# Patient Record
Sex: Female | Born: 1985 | Race: White | Hispanic: Yes | Marital: Single | Smoking: Never smoker
Health system: Southern US, Community
[De-identification: ages and names within clinical notes are randomized; demographics above are authoritative.]

## PROBLEM LIST (undated history)

## (undated) DIAGNOSIS — K759 Inflammatory liver disease, unspecified: Secondary | ICD-10-CM

## (undated) DIAGNOSIS — Z8489 Family history of other specified conditions: Secondary | ICD-10-CM

## (undated) HISTORY — PX: NO PAST SURGERIES: SHX2092

---
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
## 2007-10-18 ENCOUNTER — Emergency Department (HOSPITAL_COMMUNITY): Admission: EM | Admit: 2007-10-18 | Discharge: 2007-10-18 | Payer: Self-pay | Admitting: Family Medicine

## 2015-08-18 ENCOUNTER — Ambulatory Visit: Payer: Self-pay | Admitting: Gynecology

## 2015-08-26 ENCOUNTER — Ambulatory Visit (INDEPENDENT_AMBULATORY_CARE_PROVIDER_SITE_OTHER): Payer: BLUE CROSS/BLUE SHIELD | Admitting: Women's Health

## 2015-08-26 ENCOUNTER — Encounter: Payer: Self-pay | Admitting: Women's Health

## 2015-08-26 VITALS — BP 132/86 | Ht 70.75 in | Wt 359.0 lb

## 2015-08-26 DIAGNOSIS — N938 Other specified abnormal uterine and vaginal bleeding: Secondary | ICD-10-CM | POA: Diagnosis not present

## 2015-08-26 DIAGNOSIS — N939 Abnormal uterine and vaginal bleeding, unspecified: Secondary | ICD-10-CM

## 2015-08-26 DIAGNOSIS — Z01419 Encounter for gynecological examination (general) (routine) without abnormal findings: Secondary | ICD-10-CM

## 2015-08-26 DIAGNOSIS — N923 Ovulation bleeding: Secondary | ICD-10-CM

## 2015-08-26 LAB — PREGNANCY, URINE: Preg Test, Ur: NEGATIVE

## 2015-08-26 MED ORDER — MEDROXYPROGESTERONE ACETATE 10 MG PO TABS: 10.0000 mg | ORAL_TABLET | Freq: Every day | ORAL | Status: DC

## 2015-08-26 NOTE — Progress Notes (Signed)
Mary Olson April 12, 1986 [id number]    History:    Presents for annual exam.  New patient with a problem. Has been seen by primary care and has had numerous normal labs and thyroid. Has been on phentermine in the past for weight loss with minimal success. History of cycles every 1-3 months, questionable history of PCOS. Has always had irregular cycles since starting. Has used OCs in the past with some irregular bleeding while on.  [place]. For the past 3 months she has had spotting/bleeding more days of the month than not. Did not receive gardasil. Has not had a Pap. Weight is greater than 350. States when she has regular exercise and weight is down she does have a monthly cycle. Reports numerous family members are obese.  Past medical history, past surgical history, family history and social history were all reviewed and documented in the EPIC chart. From [place] bilingual. Works in a [employer] in [place]. Mother hypertension, father diabetes.  ROS:  A ROS was performed and pertinent positives and negatives are included.  Exam:  Filed Vitals:   08/26/15 0940  BP: 132/86    General appearance:  Normal Thyroid:  Symmetrical, normal in size, without palpable masses or nodularity. Respiratory  Auscultation:  Clear without wheezing or rhonchi Cardiovascular  Auscultation:  Regular rate, without rubs, murmurs or gallops  Edema/varicosities:  Not grossly evident Abdominal  Soft,nontender, without masses, guarding or rebound.  Liver/spleen:  No organomegaly noted  Hernia:  None appreciated  Skin  Inspection:  Grossly normal   Breasts: Examined lying and sitting.     Right: Without masses, retractions, discharge or axillary adenopathy.     Left: Without masses, retractions, discharge or axillary adenopathy. Gentitourinary   Inguinal/mons:  Normal without inguinal adenopathy  External genitalia:  Normal  BUS/Urethra/Skene's glands:  Normal  Vagina:  Normal Moderate  menses  Cervix:  Normal  Uterus:   normal in size, shape and contour.  Midline and mobile  Adnexa/parametria:     Rt: Without masses or tenderness.   Lt: Without masses or tenderness.  Anus and perineum: Normal  Digital rectal exam: Normal sphincter tone without palpated masses or tenderness  Assessment/Plan:  30 y.o. [facility] for annual exam with irregular bleeding.  Irregular bleeding/spotting for the past 3 months Morbid obesity Labs-primary care  Plan: Provera 10 mg by mouth daily for 10 days, ultrasound after completing. Discussed options of birth control pills, Mirena IUD. Reviewed importance of weight loss for health, discussed weight loss surgery, encouraged to avoid phentermine due to risks/side effects and minimal weight loss. SBE's, exercise, decrease calories, MVI daily encouraged. Contraception reviewed and declined need. Pap. UPT negative    Mary ChallengerYOUNG,Mary Olson [facility] PartnersWHNP, 11:43 AM 08/26/2015

## 2015-08-26 NOTE — Patient Instructions (Addendum)
Sleeve Gastrectomy A sleeve gastrectomy is a surgery in which a large portion of the stomach is removed. After the surgery, the stomach will be a narrow tube about the size of a banana. This surgery is performed to help a person lose weight. The person loses weight because the reduced size of the stomach restricts the amount of food that the person can eat. The stomach will hold much less food than before the surgery. Also, the part of the stomach that is removed produces a hormone that causes hunger.  This surgery is done for people who have morbid obesity, defined as a body mass index (BMI) greater than 40. BMI is an estimate of body fat and is calculated from the height and weight of a person. This surgery may also be done for people with a BMI between 35 and 40 if they have other diseases, such as type 2 diabetes mellitus, obstructive sleep apnea, or heart and lung disorders (cardiopulmonary diseases).  LET [facility] CARE PROVIDER KNOW ABOUT:  Any allergies you have.   All medicines you are taking, including vitamins, herbs, eyedrops, creams, and over-the-counter medicines.   Use of steroids (by mouth or creams).   Previous problems you or members of your family have had with the use of anesthetics.   Any blood disorders you have.   Previous surgeries you have had.   Possibility of pregnancy, if this applies.   Other health problems you have. RISKS AND COMPLICATIONS Generally, sleeve gastrectomy is a safe procedure. However, as with any procedure, complications can occur. Possible complications include:  Infection.  Bleeding.  Blood clots.  Damage to other organs or tissue.  Leakage of fluid from the stomach into the abdominal cavity (rare). BEFORE THE PROCEDURE  You may need to have blood tests and imaging tests (such as X-rays or ultrasonography) done before the day of surgery. A test to evaluate your esophagus and how it moves (esophageal manometry) may also be  done.  You may be placed on a liquid diet 2-3 weeks before the surgery.  Ask your health care provider about changing or stopping your regular medicines.  Do not eat or drink anything for at least 8 hours before the procedure.   Make plans to have someone drive you home after your hospital stay. Also arrange for someone to help you with activities during recovery. PROCEDURE  A laparoscopic technique is usually used for this surgery:  You will be given medicine to make you sleep through the procedure (general anesthetic). This medicine will be given through an intravenous (IV) access tube that is put into one of your veins.  Once you are asleep, your abdomen will be cleaned and sterilized.  Several small incisions will be made in your abdomen.  Your abdomen will be filled with air so that it expands. This gives the surgeon more room to operate and makes your organs easier to see.  A thin, lighted tube with a tiny camera on the end (laparoscope) is put through a small incision in your abdomen. The camera on the laparoscope sends a picture to a TV screen in the operating room. This gives the surgeon a good view inside the abdomen.  Hollow tubes are put through the other small incisions in your abdomen. The tools needed for the procedure are put through these tubes.  The surgeon uses staples to divide part of the stomach and then removes it through one of the incisions.  The remaining stomach may be reinforced using stitches  or surgical glue or both to prevent leakage of the stomach contents. A small tube (drain) may be placed through one of the incisions to allow extra fluid to flow from the area.  The incisions are closed with stitches, staples, or glue. AFTER THE PROCEDURE  You will be monitored closely in a recovery area. Once the anesthetic has worn off, you will likely be moved to a regular hospital room.  You will be given medicine for pain and nausea.   You may have a drain  from one of the incisions in your abdomen. If a drain is used, it may stay in place after you go home from the hospital and be removed at a follow-up appointment.   You will be encouraged to walk around several times a day. This helps prevent blood clots.  You will be started on a liquid diet the first day after your surgery. Sometimes a test is done to check for leaking before you can eat.  You will be urged to cough and do deep breathing exercises. This helps prevent a lung infection after a surgery.  You will likely need to stay in the hospital for a few days.    This information is not intended to replace advice given to you by your health care provider. Make sure you discuss any questions you have with your health care provider.   Document Released: 03/28/2009 Document Revised: 01/31/2013 Document Reviewed: 11/22/2014 Elsevier Interactive Patient Education 2016 [place] Maintenance, Female Adopting a healthy lifestyle and getting preventive care can go a long way to promote health and wellness. Talk with your health care provider about what schedule of regular examinations is right for you. This is a good chance for you to check in with your provider about disease prevention and staying healthy. In between checkups, there are plenty of things you can do on your own. Experts have done a lot of research about which lifestyle changes and preventive measures are most likely to keep you healthy. Ask your health care provider for more information. WEIGHT AND DIET  Eat a healthy diet  Be sure to include plenty of vegetables, fruits, low-fat dairy products, and lean protein.  Do not eat a lot of foods high in solid fats, added sugars, or salt.  Get regular exercise. This is one of the most important things you can do for your health.  Most adults should exercise for at least 150 minutes each week. The exercise should increase your heart rate and make you sweat  (moderate-intensity exercise).  Most adults should also do strengthening exercises at least twice a week. This is in addition to the moderate-intensity exercise.  Maintain a healthy weight  Body mass index (BMI) is a measurement that can be used to identify possible weight problems. It estimates body fat based on height and weight. Your health care provider can help determine your BMI and help you achieve or maintain a healthy weight.  For females 16 years of age and older:   A BMI below 18.5 is considered underweight.  A BMI of 18.5 to 24.9 is normal.  A BMI of 25 to 29.9 is considered overweight.  A BMI of 30 and above is considered obese.  Watch levels of cholesterol and blood lipids  You should start having your blood tested for lipids and cholesterol at 30 years of age, then have this test every 5 years.  You may need to have your cholesterol levels checked more often if:  Your lipid  or cholesterol levels are high.  You are older than 30 years of age.  You are at high risk for heart disease.  CANCER SCREENING   Lung Cancer  Lung cancer screening is recommended for adults 65-42 years old who are at high risk for lung cancer because of a history of smoking.  A yearly low-dose CT scan of the lungs is recommended for people who:  Currently smoke.  Have quit within the past 15 years.  Have at least a 30-pack-year history of smoking. A pack year is smoking an average of one pack of cigarettes a day for 1 year.  Yearly screening should continue until it has been 15 years since you quit.  Yearly screening should stop if you develop a health problem that would prevent you from having lung cancer treatment.  Breast Cancer  Practice breast self-awareness. This means understanding how your breasts normally appear and feel.  It also means doing regular breast self-exams. Let your health care provider know about any changes, no matter how small.  If you are in your 20s  or 30s, you should have a clinical breast exam (CBE) by a health care provider every 1-3 years as part of a regular health exam.  If you are 49 or older, have a CBE every year. Also consider having a breast X-ray (mammogram) every year.  If you have a family history of breast cancer, talk to your health care provider about genetic screening.  If you are at high risk for breast cancer, talk to your health care provider about having an MRI and a mammogram every year.  Breast cancer gene (BRCA) assessment is recommended for women who have family members with BRCA-related cancers. BRCA-related cancers include:  Breast.  Ovarian.  Tubal.  Peritoneal cancers.  Results of the assessment will determine the need for genetic counseling and BRCA1 and BRCA2 testing. Cervical Cancer Your health care provider may recommend that you be screened regularly for cancer of the pelvic organs (ovaries, uterus, and vagina). This screening involves a pelvic examination, including checking for microscopic changes to the surface of your cervix (Pap test). You may be encouraged to have this screening done every 3 years, beginning at age 77.  For women ages 60-65, health care providers may recommend pelvic exams and Pap testing every 3 years, or they may recommend the Pap and pelvic exam, combined with testing for human papilloma virus (HPV), every 5 years. Some types of HPV increase your risk of cervical cancer. Testing for HPV may also be done on women of any age with unclear Pap test results.  Other health care providers may not recommend any screening for nonpregnant women who are considered low risk for pelvic cancer and who do not have symptoms. Ask your health care provider if a screening pelvic exam is right for you.  If you have had past treatment for cervical cancer or a condition that could lead to cancer, you need Pap tests and screening for cancer for at least 20 years after your treatment. If Pap tests  have been discontinued, your risk factors (such as having a new sexual partner) need to be reassessed to determine if screening should resume. Some women have medical problems that increase the chance of getting cervical cancer. In these cases, your health care provider may recommend more frequent screening and Pap tests. Colorectal Cancer  This type of cancer can be detected and often prevented.  Routine colorectal cancer screening usually begins at 30 years of age  and continues through 30 years of age.  Your health care provider may recommend screening at an earlier age if you have risk factors for colon cancer.  Your health care provider may also recommend using home test kits to check for hidden blood in the stool.  A small camera at the end of a tube can be used to examine your colon directly (sigmoidoscopy or colonoscopy). This is done to check for the earliest forms of colorectal cancer.  Routine screening usually begins at age 51.  Direct examination of the colon should be repeated every 5-10 years through 30 years of age. However, you may need to be screened more often if early forms of precancerous polyps or small growths are found. Skin Cancer  Check your skin from head to toe regularly.  Tell your health care provider about any new moles or changes in moles, especially if there is a change in a mole's shape or color.  Also tell your health care provider if you have a mole that is larger than the size of a pencil eraser.  Always use sunscreen. Apply sunscreen liberally and repeatedly throughout the day.  Protect yourself by wearing long sleeves, pants, a wide-brimmed hat, and sunglasses whenever you are outside. HEART DISEASE, DIABETES, AND HIGH BLOOD PRESSURE   High blood pressure causes heart disease and increases the risk of stroke. High blood pressure is more likely to develop in:  People who have blood pressure in the high end of the normal range (130-139/85-89 mm  Hg).  People who are overweight or obese.  People who are African American.  If you are 59-69 years of age, have your blood pressure checked every 3-5 years. If you are 51 years of age or older, have your blood pressure checked every year. You should have your blood pressure measured twice--once when you are at a hospital or clinic, and once when you are not at a hospital or clinic. Record the average of the two measurements. To check your blood pressure when you are not at a hospital or clinic, you can use:  An automated blood pressure machine at a pharmacy.  A home blood pressure monitor.  If you are between 45 years and 59 years old, ask your health care provider if you should take aspirin to prevent strokes.  Have regular diabetes screenings. This involves taking a blood sample to check your fasting blood sugar level.  If you are at a normal weight and have a low risk for diabetes, have this test once every three years after 30 years of age.  If you are overweight and have a high risk for diabetes, consider being tested at a younger age or more often. PREVENTING INFECTION  Hepatitis B  If you have a higher risk for hepatitis B, you should be screened for this virus. You are considered at high risk for hepatitis B if:  You were born in a country where hepatitis B is common. Ask your health care provider which countries are considered high risk.  Your parents were born in a high-risk country, and you have not been immunized against hepatitis B (hepatitis B vaccine).  You have HIV or AIDS.  You use needles to inject street drugs.  You live with someone who has hepatitis B.  You have had sex with someone who has hepatitis B.  You get hemodialysis treatment.  You take certain medicines for conditions, including cancer, organ transplantation, and autoimmune conditions. Hepatitis C  Blood testing is recommended for:  Everyone born from [birth year] through 1965.  Anyone with known  risk factors for hepatitis C. Sexually transmitted infections (STIs)  You should be screened for sexually transmitted infections (STIs) including gonorrhea and chlamydia if:  You are sexually active and are younger than 30 years of age.  You are older than 29 years of age and your health care provider tells you that you are at risk for this type of infection.  Your sexual activity has changed since you were last screened and you are at an increased risk for chlamydia or gonorrhea. Ask your health care provider if you are at risk.  If you do not have HIV, but are at risk, it may be recommended that you take a prescription medicine daily to prevent HIV infection. This is called pre-exposure prophylaxis (PrEP). You are considered at risk if:  You are sexually active and do not regularly use condoms or know the HIV status of your partner(s).  You take drugs by injection.  You are sexually active with a partner who has HIV. Talk with your health care provider about whether you are at high risk of being infected with HIV. If you choose to begin PrEP, you should first be tested for HIV. You should then be tested every 3 months for as long as you are taking PrEP.  PREGNANCY   If you are premenopausal and you may become pregnant, ask your health care provider about preconception counseling.  If you may become pregnant, take 400 to 800 micrograms (mcg) of folic acid every day.  If you want to prevent pregnancy, talk to your health care provider about birth control (contraception). OSTEOPOROSIS AND MENOPAUSE   Osteoporosis is a disease in which the bones lose minerals and strength with aging. This can result in serious bone fractures. Your risk for osteoporosis can be identified using a bone density scan.  If you are 27 years of age or older, or if you are at risk for osteoporosis and fractures, ask your health care provider if you should be screened.  Ask your health care provider whether you  should take a calcium or vitamin D supplement to lower your risk for osteoporosis.  Menopause may have certain physical symptoms and risks.  Hormone replacement therapy may reduce some of these symptoms and risks. Talk to your health care provider about whether hormone replacement therapy is right for you.  HOME CARE INSTRUCTIONS   Schedule regular health, dental, and eye exams.  Stay current with your immunizations.   Do not use any tobacco products including cigarettes, chewing tobacco, or electronic cigarettes.  If you are pregnant, do not drink alcohol.  If you are breastfeeding, limit how much and how often you drink alcohol.  Limit alcohol intake to no more than 1 drink per day for nonpregnant women. One drink equals 12 ounces of beer, 5 ounces of wine, or 1 ounces of hard liquor.  Do not use street drugs.  Do not share needles.  Ask your health care provider for help if you need support or information about quitting drugs.  Tell your health care provider if you often feel depressed.  Tell your health care provider if you have ever been abused or do not feel safe at home.   This information is not intended to replace advice given to you by your health care provider. Make sure you discuss any questions you have with your health care provider.   Document Released: 12/14/2010 Document Revised: 06/21/2014 Document Reviewed: 05/02/2013 Elsevier Interactive  Patient Education 2016 [employer]. Health Maintenance, Female Adopting a healthy lifestyle and getting preventive care can go a long way to promote health and wellness. Talk with your health care provider about what schedule of regular examinations is right for you. This is a good chance for you to check in with your provider about disease prevention and staying healthy. In between checkups, there are plenty of things you can do on your own. Experts have done a lot of research about which lifestyle changes and preventive  measures are most likely to keep you healthy. Ask your health care provider for more information. WEIGHT AND DIET  Eat a healthy diet  Be sure to include plenty of vegetables, fruits, low-fat dairy products, and lean protein.  Do not eat a lot of foods high in solid fats, added sugars, or salt.  Get regular exercise. This is one of the most important things you can do for your health.  Most adults should exercise for at least 150 minutes each week. The exercise should increase your heart rate and make you sweat (moderate-intensity exercise).  Most adults should also do strengthening exercises at least twice a week. This is in addition to the moderate-intensity exercise.  Maintain a healthy weight  Body mass index (BMI) is a measurement that can be used to identify possible weight problems. It estimates body fat based on height and weight. Your health care provider can help determine your BMI and help you achieve or maintain a healthy weight.  For females 43 years of age and older:   A BMI below 18.5 is considered underweight.  A BMI of 18.5 to 24.9 is normal.  A BMI of 25 to 29.9 is considered overweight.  A BMI of 30 and above is considered obese.  Watch levels of cholesterol and blood lipids  You should start having your blood tested for lipids and cholesterol at 30 years of age, then have this test every 5 years.  You may need to have your cholesterol levels checked more often if:  Your lipid or cholesterol levels are high.  You are older than 30 years of age.  You are at high risk for heart disease.  CANCER SCREENING   Lung Cancer  Lung cancer screening is recommended for adults 89-62 years old who are at high risk for lung cancer because of a history of smoking.  A yearly low-dose CT scan of the lungs is recommended for people who:  Currently smoke.  Have quit within the past 15 years.  Have at least a 30-pack-year history of smoking. A pack year is smoking  an average of one pack of cigarettes a day for 1 year.  Yearly screening should continue until it has been 15 years since you quit.  Yearly screening should stop if you develop a health problem that would prevent you from having lung cancer treatment.  Breast Cancer  Practice breast self-awareness. This means understanding how your breasts normally appear and feel.  It also means doing regular breast self-exams. Let your health care provider know about any changes, no matter how small.  If you are in your 20s or 30s, you should have a clinical breast exam (CBE) by a health care provider every 1-3 years as part of a regular health exam.  If you are 39 or older, have a CBE every year. Also consider having a breast X-ray (mammogram) every year.  If you have a family history of breast cancer, talk to your health care provider  about genetic screening.  If you are at high risk for breast cancer, talk to your health care provider about having an MRI and a mammogram every year.  Breast cancer gene (BRCA) assessment is recommended for women who have family members with BRCA-related cancers. BRCA-related cancers include:  Breast.  Ovarian.  Tubal.  Peritoneal cancers.  Results of the assessment will determine the need for genetic counseling and BRCA1 and BRCA2 testing. Cervical Cancer Your health care provider may recommend that you be screened regularly for cancer of the pelvic organs (ovaries, uterus, and vagina). This screening involves a pelvic examination, including checking for microscopic changes to the surface of your cervix (Pap test). You may be encouraged to have this screening done every 3 years, beginning at age 73.  For women ages 26-65, health care providers may recommend pelvic exams and Pap testing every 3 years, or they may recommend the Pap and pelvic exam, combined with testing for human papilloma virus (HPV), every 5 years. Some types of HPV increase your risk of cervical  cancer. Testing for HPV may also be done on women of any age with unclear Pap test results.  Other health care providers may not recommend any screening for nonpregnant women who are considered low risk for pelvic cancer and who do not have symptoms. Ask your health care provider if a screening pelvic exam is right for you.  If you have had past treatment for cervical cancer or a condition that could lead to cancer, you need Pap tests and screening for cancer for at least 20 years after your treatment. If Pap tests have been discontinued, your risk factors (such as having a new sexual partner) need to be reassessed to determine if screening should resume. Some women have medical problems that increase the chance of getting cervical cancer. In these cases, your health care provider may recommend more frequent screening and Pap tests. Colorectal Cancer  This type of cancer can be detected and often prevented.  Routine colorectal cancer screening usually begins at 30 years of age and continues through 30 years of age.  Your health care provider may recommend screening at an earlier age if you have risk factors for colon cancer.  Your health care provider may also recommend using home test kits to check for hidden blood in the stool.  A small camera at the end of a tube can be used to examine your colon directly (sigmoidoscopy or colonoscopy). This is done to check for the earliest forms of colorectal cancer.  Routine screening usually begins at age 94.  Direct examination of the colon should be repeated every 5-10 years through 30 years of age. However, you may need to be screened more often if early forms of precancerous polyps or small growths are found. Skin Cancer  Check your skin from head to toe regularly.  Tell your health care provider about any new moles or changes in moles, especially if there is a change in a mole's shape or color.  Also tell your health care provider if you have a  mole that is larger than the size of a pencil eraser.  Always use sunscreen. Apply sunscreen liberally and repeatedly throughout the day.  Protect yourself by wearing long sleeves, pants, a wide-brimmed hat, and sunglasses whenever you are outside. HEART DISEASE, DIABETES, AND HIGH BLOOD PRESSURE   High blood pressure causes heart disease and increases the risk of stroke. High blood pressure is more likely to develop in:  People who have  blood pressure in the high end of the normal range (130-139/85-89 mm Hg).  People who are overweight or obese.  People who are African American.  If you are 4-89 years of age, have your blood pressure checked every 3-5 years. If you are 19 years of age or older, have your blood pressure checked every year. You should have your blood pressure measured twice--once when you are at a hospital or clinic, and once when you are not at a hospital or clinic. Record the average of the two measurements. To check your blood pressure when you are not at a hospital or clinic, you can use:  An automated blood pressure machine at a pharmacy.  A home blood pressure monitor.  If you are between 55 years and 83 years old, ask your health care provider if you should take aspirin to prevent strokes.  Have regular diabetes screenings. This involves taking a blood sample to check your fasting blood sugar level.  If you are at a normal weight and have a low risk for diabetes, have this test once every three years after 30 years of age.  If you are overweight and have a high risk for diabetes, consider being tested at a younger age or more often. PREVENTING INFECTION  Hepatitis B  If you have a higher risk for hepatitis B, you should be screened for this virus. You are considered at high risk for hepatitis B if:  You were born in a country where hepatitis B is common. Ask your health care provider which countries are considered high risk.  Your parents were born in a  high-risk country, and you have not been immunized against hepatitis B (hepatitis B vaccine).  You have HIV or AIDS.  You use needles to inject street drugs.  You live with someone who has hepatitis B.  You have had sex with someone who has hepatitis B.  You get hemodialysis treatment.  You take certain medicines for conditions, including cancer, organ transplantation, and autoimmune conditions. Hepatitis C  Blood testing is recommended for:  Everyone born from [birth year] through 1965.  Anyone with known risk factors for hepatitis C. Sexually transmitted infections (STIs)  You should be screened for sexually transmitted infections (STIs) including gonorrhea and chlamydia if:  You are sexually active and are younger than 30 years of age.  You are older than 30 years of age and your health care provider tells you that you are at risk for this type of infection.  Your sexual activity has changed since you were last screened and you are at an increased risk for chlamydia or gonorrhea. Ask your health care provider if you are at risk.  If you do not have HIV, but are at risk, it may be recommended that you take a prescription medicine daily to prevent HIV infection. This is called pre-exposure prophylaxis (PrEP). You are considered at risk if:  You are sexually active and do not regularly use condoms or know the HIV status of your partner(s).  You take drugs by injection.  You are sexually active with a partner who has HIV. Talk with your health care provider about whether you are at high risk of being infected with HIV. If you choose to begin PrEP, you should first be tested for HIV. You should then be tested every 3 months for as long as you are taking PrEP.  PREGNANCY   If you are premenopausal and you may become pregnant, ask your health care provider about preconception  counseling.  If you may become pregnant, take 400 to 800 micrograms (mcg) of folic acid every day.  If you  want to prevent pregnancy, talk to your health care provider about birth control (contraception). OSTEOPOROSIS AND MENOPAUSE   Osteoporosis is a disease in which the bones lose minerals and strength with aging. This can result in serious bone fractures. Your risk for osteoporosis can be identified using a bone density scan.  If you are 4 years of age or older, or if you are at risk for osteoporosis and fractures, ask your health care provider if you should be screened.  Ask your health care provider whether you should take a calcium or vitamin D supplement to lower your risk for osteoporosis.  Menopause may have certain physical symptoms and risks.  Hormone replacement therapy may reduce some of these symptoms and risks. Talk to your health care provider about whether hormone replacement therapy is right for you.  HOME CARE INSTRUCTIONS   Schedule regular health, dental, and eye exams.  Stay current with your immunizations.   Do not use any tobacco products including cigarettes, chewing tobacco, or electronic cigarettes.  If you are pregnant, do not drink alcohol.  If you are breastfeeding, limit how much and how often you drink alcohol.  Limit alcohol intake to no more than 1 drink per day for nonpregnant women. One drink equals 12 ounces of beer, 5 ounces of wine, or 1 ounces of hard liquor.  Do not use street drugs.  Do not share needles.  Ask your health care provider for help if you need support or information about quitting drugs.  Tell your health care provider if you often feel depressed.  Tell your health care provider if you have ever been abused or do not feel safe at home.   This information is not intended to replace advice given to you by your health care provider. Make sure you discuss any questions you have with your health care provider.   Document Released: 12/14/2010 Document Revised: 06/21/2014 Document Reviewed: 05/02/2013 Elsevier Interactive Patient  Education 2016 Elsevier Inc. Sleeve Gastrectomy A sleeve gastrectomy is a surgery in which a large portion of the stomach is removed. After the surgery, the stomach will be a narrow tube about the size of a banana. This surgery is performed to help a person lose weight. The person loses weight because the reduced size of the stomach restricts the amount of food that the person can eat. The stomach will hold much less food than before the surgery. Also, the part of the stomach that is removed produces a hormone that causes hunger.  This surgery is done for people who have morbid obesity, defined as a body mass index (BMI) greater than 40. BMI is an estimate of body fat and is calculated from the height and weight of a person. This surgery may also be done for people with a BMI between 35 and 40 if they have other diseases, such as type 2 diabetes mellitus, obstructive sleep apnea, or heart and lung disorders (cardiopulmonary diseases).  LET [facility] CARE PROVIDER KNOW ABOUT:  Any allergies you have.   All medicines you are taking, including vitamins, herbs, eyedrops, creams, and over-the-counter medicines.   Use of steroids (by mouth or creams).   Previous problems you or members of your family have had with the use of anesthetics.   Any blood disorders you have.   Previous surgeries you have had.   Possibility of pregnancy, if this  applies.   Other health problems you have. RISKS AND COMPLICATIONS Generally, sleeve gastrectomy is a safe procedure. However, as with any procedure, complications can occur. Possible complications include:  Infection.  Bleeding.  Blood clots.  Damage to other organs or tissue.  Leakage of fluid from the stomach into the abdominal cavity (rare). BEFORE THE PROCEDURE  You may need to have blood tests and imaging tests (such as X-rays or ultrasonography) done before the day of surgery. A test to evaluate your esophagus and how it moves  (esophageal manometry) may also be done.  You may be placed on a liquid diet 2-3 weeks before the surgery.  Ask your health care provider about changing or stopping your regular medicines.  Do not eat or drink anything for at least 8 hours before the procedure.   Make plans to have someone drive you home after your hospital stay. Also arrange for someone to help you with activities during recovery. PROCEDURE  A laparoscopic technique is usually used for this surgery:  You will be given medicine to make you sleep through the procedure (general anesthetic). This medicine will be given through an intravenous (IV) access tube that is put into one of your veins.  Once you are asleep, your abdomen will be cleaned and sterilized.  Several small incisions will be made in your abdomen.  Your abdomen will be filled with air so that it expands. This gives the surgeon more room to operate and makes your organs easier to see.  A thin, lighted tube with a tiny camera on the end (laparoscope) is put through a small incision in your abdomen. The camera on the laparoscope sends a picture to a TV screen in the operating room. This gives the surgeon a good view inside the abdomen.  Hollow tubes are put through the other small incisions in your abdomen. The tools needed for the procedure are put through these tubes.  The surgeon uses staples to divide part of the stomach and then removes it through one of the incisions.  The remaining stomach may be reinforced using stitches or surgical glue or both to prevent leakage of the stomach contents. A small tube (drain) may be placed through one of the incisions to allow extra fluid to flow from the area.  The incisions are closed with stitches, staples, or glue. AFTER THE PROCEDURE  You will be monitored closely in a recovery area. Once the anesthetic has worn off, you will likely be moved to a regular hospital room.  You will be given medicine for pain and  nausea.   You may have a drain from one of the incisions in your abdomen. If a drain is used, it may stay in place after you go home from the hospital and be removed at a follow-up appointment.   You will be encouraged to walk around several times a day. This helps prevent blood clots.  You will be started on a liquid diet the first day after your surgery. Sometimes a test is done to check for leaking before you can eat.  You will be urged to cough and do deep breathing exercises. This helps prevent a lung infection after a surgery.  You will likely need to stay in the hospital for a few days.    This information is not intended to replace advice given to you by your health care provider. Make sure you discuss any questions you have with your health care provider.   Document Released: 03/28/2009 Document  Revised: 01/31/2013 Document Reviewed: 11/22/2014 Elsevier Interactive Patient Education [employer].

## 2015-08-28 LAB — PAP IG W/ RFLX HPV ASCU

## 2015-09-03 ENCOUNTER — Ambulatory Visit: Payer: BLUE CROSS/BLUE SHIELD | Admitting: Women's Health

## 2015-09-03 ENCOUNTER — Other Ambulatory Visit: Payer: BLUE CROSS/BLUE SHIELD

## 2015-09-10 ENCOUNTER — Ambulatory Visit: Payer: BLUE CROSS/BLUE SHIELD | Admitting: Women's Health

## 2015-09-10 ENCOUNTER — Other Ambulatory Visit: Payer: BLUE CROSS/BLUE SHIELD

## 2015-09-17 ENCOUNTER — Encounter: Payer: Self-pay | Admitting: Women's Health

## 2015-09-17 ENCOUNTER — Ambulatory Visit (INDEPENDENT_AMBULATORY_CARE_PROVIDER_SITE_OTHER): Payer: BLUE CROSS/BLUE SHIELD

## 2015-09-17 ENCOUNTER — Other Ambulatory Visit (HOSPITAL_COMMUNITY): Payer: Self-pay | Admitting: General Surgery

## 2015-09-17 ENCOUNTER — Ambulatory Visit (INDEPENDENT_AMBULATORY_CARE_PROVIDER_SITE_OTHER): Payer: BLUE CROSS/BLUE SHIELD | Admitting: Women's Health

## 2015-09-17 VITALS — BP 128/78 | Ht 70.0 in | Wt 359.0 lb

## 2015-09-17 DIAGNOSIS — N923 Ovulation bleeding: Secondary | ICD-10-CM

## 2015-09-17 DIAGNOSIS — N939 Abnormal uterine and vaginal bleeding, unspecified: Secondary | ICD-10-CM

## 2015-09-17 DIAGNOSIS — N926 Irregular menstruation, unspecified: Secondary | ICD-10-CM

## 2015-09-17 DIAGNOSIS — Z6841 Body Mass Index (BMI) 40.0 and over, adult: Principal | ICD-10-CM

## 2015-09-17 NOTE — Patient Instructions (Signed)
Polycystic Ovarian Syndrome  Polycystic ovarian syndrome (PCOS) is a common hormonal disorder among women of reproductive age. Most women with PCOS grow many small cysts on their ovaries. PCOS can cause problems with your periods and make it difficult to get pregnant. It can also cause an increased risk of miscarriage with pregnancy. If left untreated, PCOS can lead to serious health problems, such as diabetes and heart disease.  CAUSES  The cause of PCOS is not fully understood, but genetics may be a factor.  SIGNS AND SYMPTOMS   · Infrequent or no menstrual periods.    · Inability to get pregnant (infertility) because of not ovulating.    · Increased growth of hair on the face, chest, stomach, back, thumbs, thighs, or toes.    · Acne, oily skin, or dandruff.    · Pelvic pain.    · Weight gain or obesity, usually carrying extra weight around the waist.    · Type 2 diabetes.     · High cholesterol.    · High blood pressure.    · Female-pattern baldness or thinning hair.    · Patches of thickened and dark brown or black skin on the neck, arms, breasts, or thighs.    · Tiny excess flaps of skin (skin tags) in the armpits or neck area.    · Excessive snoring and having breathing stop at times while asleep (sleep apnea).    · Deepening of the voice.    · Gestational diabetes when pregnant.    DIAGNOSIS   There is no single test to diagnose PCOS.   · Your health care provider will:      Take a medical history.      Perform a pelvic exam.      Have ultrasonography done.      Check your female and female hormone levels.      Measure glucose or sugar levels in the blood.      Do other blood tests.    · If you are producing too many female hormones, your health care provider will make sure it is from PCOS. At the physical exam, your health care provider will want to evaluate the areas of increased hair growth. Try to allow natural hair growth for a few days before the visit.    · During a pelvic exam, the ovaries may be enlarged  or swollen because of the increased number of small cysts. This can be seen more easily by using vaginal ultrasonography or screening to examine the ovaries and lining of the uterus (endometrium) for cysts. The uterine lining may become thicker if you have not been having a regular period.    TREATMENT   Because there is no cure for PCOS, it needs to be managed to prevent problems. Treatments are based on your symptoms. Treatment is also based on whether you want to have a baby or whether you need contraception.   Treatment may include:   · Progesterone hormone to start a menstrual period.    · Birth control pills to make you have regular menstrual periods.    · Medicines to make you ovulate, if you want to get pregnant.    · Medicines to control your insulin.    · Medicine to control your blood pressure.    · Medicine and diet to control your high cholesterol and triglycerides in your blood.  · Medicine to reduce excessive hair growth.   · Surgery, making small holes in the ovary, to decrease the amount of female hormone production. This is done through a long, lighted tube (laparoscope) placed into the pelvis through a tiny incision in the lower abdomen.      HOME CARE INSTRUCTIONS  · Only take over-the-counter or prescription medicine as directed by your health care provider.  · Pay attention to the foods you eat and your activity levels. This can help reduce the effects of PCOS.    Keep your weight under control.    Eat foods that are low in carbohydrate and high in fiber.    Exercise regularly.  SEEK MEDICAL CARE IF:  · Your symptoms do not get better with medicine.  · You have new symptoms.     This information is not intended to replace advice given to you by your health care provider. Make sure you discuss any questions you have with your health care provider.     Document Released: 09/24/2004 Document Revised: 03/21/2013 Document Reviewed: 11/16/2012  Elsevier Interactive Patient Education ©2016 Elsevier  Inc.

## 2015-09-17 NOTE — Progress Notes (Signed)
Patient ID: Camillia HerterAnny Gomez-Nunez, female   DOB: Jul 10, 1985, 30 y.o.   MRN: [id number] Presents for ultrasound. At annual exam was having DUB with longer cycles. Cycle in February lasted 10 days and bled most of the month of March. Prior to February cycles were monthly. Bleeding stopped after 3 days of Provera. Not sexually active. Morbid obesity and has spoken to a surgeon  for possible gastric sleeve. Reports with weight loss in the past cycles are more regular/lighter and last between 5 and 6 days. Has recently started increasing exercise and decreasing calories  Sam: Morbid obesity ultrasound: T/V retroverted uterus with homogeneous myometrium. Endometrium tri layered. Right ovary increased ovarian volume 13.2 cc, normal echo pattern. Left ovary normal echo, increased ovarian volume 9.6cc. Fluid in cul-de-sac 20 x 17 mm.  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 Encouraged to eat dinner earlier, decrease portion size and follow-up with surgeon for possible weight loss surgery.

## 2015-09-18 ENCOUNTER — Other Ambulatory Visit (HOSPITAL_COMMUNITY): Payer: Self-pay | Admitting: General Surgery

## 2015-09-23 ENCOUNTER — Ambulatory Visit (HOSPITAL_COMMUNITY)
Admission: RE | Admit: 2015-09-23 | Discharge: 2015-09-23 | Disposition: A | Discharge status: Home / Self Care | Payer: BLUE CROSS/BLUE SHIELD | Source: Ambulatory Visit | Attending: General Surgery | Admitting: General Surgery

## 2015-09-23 DIAGNOSIS — Z6841 Body Mass Index (BMI) 40.0 and over, adult: Secondary | ICD-10-CM | POA: Diagnosis present

## 2015-09-26 ENCOUNTER — Ambulatory Visit (HOSPITAL_COMMUNITY): Payer: Self-pay

## 2015-10-06 ENCOUNTER — Ambulatory Visit: Payer: Self-pay | Admitting: Dietician

## 2015-10-15 ENCOUNTER — Encounter: Payer: Self-pay | Admitting: Dietician

## 2015-10-15 ENCOUNTER — Encounter: Payer: BLUE CROSS/BLUE SHIELD | Attending: General Surgery | Admitting: Dietician

## 2015-10-15 DIAGNOSIS — Z01818 Encounter for other preprocedural examination: Secondary | ICD-10-CM | POA: Insufficient documentation

## 2015-10-15 NOTE — Progress Notes (Signed)
  Pre-Op Assessment Visit:  Pre-Operative Sleeve Gastrectomy Surgery  Medical Nutrition Therapy:  Appt start time: 1105   End time:  1140.  Patient was seen on 10/15/2015 for Pre-Operative Nutrition Assessment. [facility] on 10/15/2015.   Preferred Learning Style:   No preference indicated   Learning Readiness:   Ready  Handouts given during visit include:  Pre-Op Goals Bariatric Surgery Protein Shakes   During the appointment today the following Pre-Op Goals were reviewed with the patient: Maintain or lose weight as instructed by your surgeon Make healthy food choices Begin to limit portion sizes Limited concentrated sugars and fried foods Keep fat/sugar in the single digits per serving on   food labels Practice CHEWING your food  (aim for 30 chews per bite or until applesauce consistency) Practice not drinking 15 minutes before, during, and 30 minutes after each meal/snack Avoid all carbonated beverages  Avoid/limit caffeinated beverages  Avoid all sugar-sweetened beverages Consume 3 meals per day; eat every 3-5 hours Make a list of non-food related activities Aim for 64-100 ounces of FLUID daily  Aim for at least 60-80 grams of PROTEIN daily Look for a liquid protein source that contain ?15 g protein and ?5 g carbohydrate  (ex: shakes, drinks, shots)  Patient-Centered Goals: Goals: be more active and healthier, be more comfortable going out   10 confidence/10 importance   Demonstrated degree of understanding via:  Teach Back  Teaching Method Utilized:  Visual Auditory Hands on  Barriers to learning/adherence to lifestyle change: none  Patient to call the [facility] to enroll in Pre-Op and Post-Op Nutrition Education when surgery date is scheduled.

## 2015-10-15 NOTE — Patient Instructions (Signed)
Follow Pre-Op Goals Try Protein Shakes Call [facility] at [phone] when surgery is scheduled to enroll in Pre-Op Class  Things to remember:  Please always be honest with us. We want to support you!  If you have any questions or concerns in between appointments, please call or email Liz, Leslie, or Laurie.  The diet after surgery will be high protein and low in carbohydrate.  Vitamins and calcium need to be taken for the rest of your life.  Feel free to include support people in any classes or appointments.   Supplement recommendations:  Complete" Multivitamin: Sleeve Gastrectomy and RYGB patients take a double dose of MVI. LAGB patients take single dose as it is written on the package. Vitamin must be liquid or chewable but not gummy. Examples of these include Flintstones Complete and Centrum Complete. If the vitamin is bariatric-specific, take 1 dose as it is already formulated for bariatric surgery patients. Examples of these are Bariatric Advantage, Celebrate, and Wellesse. These can be found at the [facility] and/or online.     Calcium citrate: 1500 mg/day of Calcium citrate (also chewable or liquid) is recommended for all procedures. The body is only able to absorb 500-600 mg of Calcium at one time so 3 daily doses of 500 mg are recommended. Calcium doses must be taken a minimum of 2 hours apart. Additionally, Calcium must be taken 2 hours apart from iron-containing MVI. Examples of brands include Celebrate, Bariatric Advantage, and Wellesse. These brands must be purchased online or at the  Outpatient Pharmacy. Citracal Petites is the only Calcium citrate supplement found in general grocery stores and pharmacies. This is in tablet form and may be recommended for patients who do not tolerate chewable Calcium.  Continued or added Vitamin D supplementation based on individual needs.    Vitamin B12: 300-500 mcg/day for Sleeve Gastrectomy and RYGB. Optional for  LAGB patients as stomach remains fully intact. Must be taken intramuscularly, sublingually, or inhaled nasally. Oral route is not recommended. 

## 2015-11-13 ENCOUNTER — Encounter: Payer: BLUE CROSS/BLUE SHIELD | Attending: General Surgery | Admitting: Dietician

## 2015-11-13 ENCOUNTER — Encounter: Payer: Self-pay | Admitting: Dietician

## 2015-11-13 DIAGNOSIS — Z01818 Encounter for other preprocedural examination: Secondary | ICD-10-CM | POA: Diagnosis not present

## 2015-11-13 NOTE — Patient Instructions (Addendum)
Think about exercising after work to help with anxiety. Continue to not keep sweets and chips at home. Continue to work on chewing well.  Call [facility] - do you need more supervised weight loss appointments? If you do, is it ok to have 2 in one month?

## 2015-11-13 NOTE — Progress Notes (Signed)
  Supervised Weight Loss Visit:   Pre-Operative Sleeve Gastrectomy Surgery  Medical Nutrition Therapy:  Appt start time: 0945 end time:  1000.  Primary concerns today: Supervised Weight Loss Visit. Returns with no weight change. Has stopped drinking caffeine and carbonated beverages. Trying to have more healthy food in the house. Eats more at night since she is hungry and has anxiety after work and can more easily follow good habits during the day. Eating 3 meals per day. Drinking decaf coffee or water and decaf green tea (freshly brewed). Having at least 64 oz of water. Having some sweets but not having fried foods. Has tried 3 kinds of protein shakes and liked Premier.   Started working on chewing well and stopped drinking during meals and waiting 30 minutes to drink after meals.   Twisted her ankle but still walking about 30 minutes 5 x week.   Weight: 359.9 lbs BMI: 50.3  Preferred Learning Style:   No preference indicated   Learning Readiness:   Ready   24-hr recall: B (AM): eggs with toast Snk (AM): fruit L (PM): vegetable or salad with meat Snk (PM): fruit or almond  D (PM): cereal, sandwich Snk (PM):  If feels anxious something sweet or chips or fruit  Beverages: water, decaf coffee with splenda and sometimes milk or decaf green tea with ginger and milk and splenda  Medications: see list  Recent physical activity: walking 30 minutes 5 x week  Progress Towards Goal(s):  In progress.  Nutritional Diagnosis:  Wilson-3.3 Obesity related to past poor dietary habits and physical inactivity as evidenced by patient attending supervised weight loss for insurance approval of bariatric surgery.    Intervention:  Nutrition counseling provided. Plan: Think about exercising after work to help with anxiety. Continue to not keep sweets and chips at home. Continue to work on chewing well.  Call [facility] - do you need more supervised weight loss appointments? If you do, is it ok to  have 2 in one month?   Teaching Method Utilized:  Visual Auditory Hands on  Barriers to learning/adherence to lifestyle change: none  Demonstrated degree of understanding via:  Teach Back   Monitoring/Evaluation:  Dietary intake, exercise, and body weight. Follow up if supervised weight loss appointments are needed.

## 2015-11-20 ENCOUNTER — Ambulatory Visit (INDEPENDENT_AMBULATORY_CARE_PROVIDER_SITE_OTHER): Payer: BLUE CROSS/BLUE SHIELD | Admitting: Psychiatry

## 2015-11-21 ENCOUNTER — Ambulatory Visit: Payer: BLUE CROSS/BLUE SHIELD | Admitting: Women's Health

## 2015-11-28 ENCOUNTER — Ambulatory Visit (INDEPENDENT_AMBULATORY_CARE_PROVIDER_SITE_OTHER): Payer: BLUE CROSS/BLUE SHIELD | Admitting: Women's Health

## 2015-11-28 ENCOUNTER — Encounter: Payer: Self-pay | Admitting: Women's Health

## 2015-11-28 VITALS — BP 126/80 | Ht 70.0 in | Wt 359.0 lb

## 2015-11-28 DIAGNOSIS — N841 Polyp of cervix uteri: Secondary | ICD-10-CM

## 2015-11-28 DIAGNOSIS — Z124 Encounter for screening for malignant neoplasm of cervix: Secondary | ICD-10-CM | POA: Diagnosis not present

## 2015-11-28 NOTE — Addendum Note (Signed)
Addended by: Kem ParkinsonBARNES, Aibhlinn Kalmar on: 11/28/2015 10:55 AM   Modules accepted: Orders

## 2015-11-28 NOTE — Progress Notes (Signed)
Patient ID: Camillia HerterAnny Gomez-Nunez, female   DOB: Mar 09, 1986, 30 y.o.   MRN: [id number] Results for Pap, Pap from annual exam had no cells. Having spotting for 2 weeks, history of irregular cycles, every 1-3 months for 4-5 days. LMP March. Not sexually active. Process of having gastric sleeve.  Exam: Appears well. Morbid obesity. External genitalia within normal limits, speculum exam cervix with 2 cm and cervical polyp, ring forcep used to removed with ease sent for pathology. Pap taken.  Endocervical polyp Screening Pap  Plan: Reviewed most polyps benign. If spotting persists we'll proceed to sonohysterogram.

## 2015-12-01 LAB — PAP IG W/ RFLX HPV ASCU

## 2015-12-11 ENCOUNTER — Encounter: Payer: Self-pay | Admitting: Dietician

## 2015-12-11 ENCOUNTER — Encounter: Payer: BLUE CROSS/BLUE SHIELD | Admitting: Dietician

## 2015-12-11 DIAGNOSIS — Z01818 Encounter for other preprocedural examination: Secondary | ICD-10-CM | POA: Diagnosis not present

## 2015-12-11 NOTE — Patient Instructions (Addendum)
Continue to work on chewing well.  Continue to walk 4-5 x week for 30 minutes.  We will schedule you for Pre Op Class on 8/7 at 8:15 AM (2.5-3 hours). [facility] on or after [facility]/21.

## 2015-12-11 NOTE — Progress Notes (Signed)
  Supervised Weight Loss Visit:   Pre-Operative Sleeve Gastrectomy Surgery  Medical Nutrition Therapy:  Appt start time: 1020 end time:  1035  Primary concerns today: Supervised Weight Loss Visit. Returns with a 3 lb weight loss. Has been having some stress preparing to travel for a month. Has been working on chewing well and feels good about not drinking during and right after meals. Walking 4-5 x week. Feels like anxiety is getting better.   Weight: 356.8 lbs BMI: 49.8  Preferred Learning Style:   No preference indicated   Learning Readiness:   Ready   24-hr recall: B (AM): eggs with toast Snk (AM): fruit L (PM): vegetable or salad with meat Snk (PM): fruit or almond  D (PM): cereal, sandwich Snk (PM):  If feels anxious something sweet or chips or fruit  Beverages: water, decaf coffee with splenda and sometimes milk or decaf green tea with ginger and milk and splenda  Medications: see list  Recent physical activity: walking 30 minutes 5 x week  Progress Towards Goal(s):  In progress.  Nutritional Diagnosis:  Mary Olson-3.3 Obesity related to past poor dietary habits and physical inactivity as evidenced by patient attending supervised weight loss for insurance approval of bariatric surgery.    Intervention:  Nutrition counseling provided. Plan: Continue to work on chewing well.  Continue to walk 4-5 x week for 30 minutes.  We will schedule you for Pre Op Class on 8/7 at 8:15 AM (2.5-3 hours). [facility] about scheduling surgery on or after 8/21  Teaching Method Utilized:  Visual Auditory Hands on  Barriers to learning/adherence to lifestyle change: none  Demonstrated degree of understanding via:  Teach Back   Monitoring/Evaluation:  Dietary intake, exercise, and body weight. Follow up to attend Pre Op Class

## 2015-12-19 ENCOUNTER — Ambulatory Visit: Payer: Self-pay | Admitting: General Surgery

## 2016-01-15 ENCOUNTER — Ambulatory Visit: Payer: Self-pay | Admitting: General Surgery

## 2016-01-15 NOTE — H&P (Signed)
Mary Olson 01/15/2016 8:54 AM Location: [facility] Patient #: [id number] DOB: 1986-04-16 Single / Language: Undefined / Race: Undefined Female  History of Present Illness [name] M. [name] MD; 01/15/2016 9:26 AM) The patient is a 30 year old female who presents for a pre-op visit. She comes in today for her preoperative visit. She has been approved by her insurance coming for laparoscopic sleeve gastrectomy. I initially met her on April 5. Her weight at that time was 358 pounds with a BMI of 49.9. She denies any medical changes since her initial visit. She denies any trips to the emergency room or hospital. She denies any tobacco use. The only thing she states that is a little bit different is that she is having more reflux episodes especially if she drinks a citrus beverage. She denies any chest pain, chest pressure, shortness of breath, dyspnea on exertion, orthopnea, abdominal pain, nausea, vomiting, diarrhea, constipation, melena or hematochezia.  She has been evaluated by the nutritionist and psychologist with no red flags.   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 Fish Oil (1000MG Capsule, Oral) Active. Multi Vitamin (Oral) Active. Flonase (50MCG/ACT  Suspension, Nasal) Active. ZyrTEC (10MG Tablet, Oral) Active. Medications Reconciled OxyCODONE HCl (5MG/5ML Solution, 5-10 Milliliter Oral every four hours, as needed, Taken starting 01/15/2016) Active. Zofran ODT (4MG Tablet Disint, 1 (one) Tablet Disperse Oral every six hours, as needed, Taken starting 01/15/2016) Active. Protonix (40MG Tablet DR, 1 (one) Tablet Oral daily, Taken starting 01/15/2016) Active.  Social History [name], MD; 01/15/2016 9:24 AM) Tobacco use Never smoker. Alcohol use Occasional alcohol use. Caffeine use Coffee. No drug use  Family History [name], MD; 01/15/2016 9:24 AM) Anesthetic complications Mother. Cerebrovascular Accident Family Members In General. Colon Polyps Father. Hypertension Mother. Respiratory Condition Father. Diabetes Mellitus Father. Heart Disease Family Members In General. Heart disease in female family member before age 53  Pregnancy / Birth History [name], MD; 01/15/2016 9:24 AM) Age at menarche 27 years. Contraceptive History Oral contraceptives. Gravida 0 Irregular periods Para 0     Review of Systems [name] M. [name] MD; 01/15/2016 9:26 AM) General Present- Fatigue and Weight Gain. Not Present- Appetite Loss, Chills, Fever, Night Sweats and Weight Loss. Skin Present- Dryness. Not Present- Change in Wart/Mole, Hives, Jaundice, New Lesions, Non-Healing Wounds, Rash and Ulcer. HEENT Present- Seasonal Allergies and Wears glasses/contact lenses. Not Present- Earache, Hearing Loss, Hoarseness, Nose Bleed, Oral Ulcers, Ringing in the Ears, Sinus Pain, Sore Throat, Visual Disturbances and Yellow Eyes. Respiratory Present- Snoring. Not Present- Bloody sputum, Chronic Cough, Difficulty Breathing and Wheezing. Breast Not Present- Breast Mass, Breast Pain, Nipple Discharge and Skin Changes. Cardiovascular Not Present- Chest Pain, Difficulty Breathing Lying Down, Leg Cramps, Palpitations, Rapid Heart  Rate, Shortness of Breath and Swelling of Extremities. Gastrointestinal Not Present- Abdominal Pain, Bloating, Bloody Stool, Change in Bowel Habits, Chronic diarrhea,  Constipation, Difficulty Swallowing, Excessive gas, Gets full quickly at meals, Hemorrhoids, Indigestion, Nausea, Rectal Pain and Vomiting. Female Genitourinary Not Present- Frequency, Nocturia, Painful Urination, Pelvic Pain and Urgency. Musculoskeletal Not Present- Back Pain, Joint Pain, Joint Stiffness, Muscle Pain, Muscle Weakness and Swelling of Extremities. Neurological Not Present- Decreased Memory, Fainting, Headaches, Numbness, Seizures, Tingling, Tremor, Trouble walking and Weakness. Psychiatric Present- Anxiety. Not Present- Bipolar, Change in Sleep Pattern, Depression, Fearful and Frequent crying. Endocrine Present- Excessive Hunger. Not Present- Cold Intolerance, Hair Changes, Heat Intolerance, Hot flashes and New Diabetes. Hematology Not Present- Easy Bruising, Excessive bleeding, Gland problems, HIV and Persistent Infections.  Vitals (Sonya Bynum CMA; 01/15/2016 8:55 AM) 01/15/2016 8:54 AM Weight: 356 lb Height: 71in Body Surface Area: 2.69 m Body Mass Index: 49.65 kg/m  Temp.: 37F(Temporal)  Pulse: 82 (Regular)  BP: 130/84 (Sitting, Left Arm, Standard)      Physical Exam [name] M. [name] MD; 01/15/2016 9:26 AM)  General Mental Status-Alert. General Appearance-Consistent with stated age. Hydration-Well hydrated. Voice-Normal. Note: morbidly obese, evenly distributed  Head and Neck Head-normocephalic, atraumatic with no lesions or palpable masses. Trachea-midline. Thyroid Gland Characteristics - normal size and consistency.  Eye Eyeball - Bilateral-Extraocular movements intact. Sclera/Conjunctiva - Bilateral-No scleral icterus.  Chest and Lung Exam Chest and lung exam reveals -quiet, even and easy respiratory effort with no use of accessory muscles and on auscultation,  normal breath sounds, no adventitious sounds and normal vocal resonance. Inspection Chest Wall - Normal. Back - normal.  Breast - Did not examine.  Cardiovascular Cardiovascular examination reveals -normal heart sounds, regular rate and rhythm with no murmurs and normal pedal pulses bilaterally.  Abdomen Inspection Inspection of the abdomen reveals - No Hernias. Skin - Scar - no surgical scars. Palpation/Percussion Palpation and Percussion of the abdomen reveal - Soft, Non Tender, No Rebound tenderness, No Rigidity (guarding) and No hepatosplenomegaly. Auscultation Auscultation of the abdomen reveals - Bowel sounds normal.  Peripheral Vascular Upper Extremity Palpation - Pulses bilaterally normal.  Neurologic Neurologic evaluation reveals -alert and oriented x 3 with no impairment of recent or remote memory. Mental Status-Normal.  Neuropsychiatric The patient's mood and affect are described as -normal. Judgment and Insight-insight is appropriate concerning matters relevant to self.  Musculoskeletal Normal Exam - Left-Upper Extremity Strength Normal and Lower Extremity Strength Normal. Normal Exam - Right-Upper Extremity Strength Normal and Lower Extremity Strength Normal. Note: Left knee crepitus  Lymphatic Head & Neck  General Head & Neck Lymphatics: Bilateral - Description - Normal. Axillary - Did not examine. Femoral & Inguinal - Did not examine.    Assessment & Plan [name] M. Nakaiya Beddow MD; 01/15/2016 9:24 AM)  Virgina Evener OBESITY WITH BMI OF 45.0-49.9, ADULT (E66.01) Impression: We reviewed her preoperative workup. Her upper GI was within normal limits there is no evidence of a hiatal hernia however she states that she is having some more frequent reflux. I explained that we would evaluate her for a hiatal hernia intraoperatively and repaired if found. We discussed with that would entail. We discussed the results of her bariatric evaluation labs. Most of them  were all normal. We discussed the importance of the preoperative diet plan. She has her preoperative class on Monday. She was given her postoperative pain, heartburn, and nausea prescriptions today. We discussed the typical postoperative course. We discussed the typical postoperative diet progression as well. All of her questions were addressed and answered. I encouraged her to contact the office if she had any questions between now and surgery  Current Plans Pt  Education - EMW_preopbariatric Started OxyCODONE HCl 5MG/5ML, 5-10 Milliliter every four hours, as needed, 200 Milliliter, 01/15/2016, No Refill. Started Zofran ODT 4MG, 1 (one) Tablet Disperse every six hours, as needed, #20, 01/15/2016, No Refill. Started Protonix 40MG, 1 (one) Tablet daily, #30, 30 days starting 01/15/2016, Ref. x3. 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 HDL level 43.  PREDIABETES (R73.03) Impression: Her initial evaluation labs showed a hemoglobin A1c of 5.7. We will monitor this over time  Leighton Ruff. Redmond Pulling, MD, FACS General, Bariatric, & Minimally Invasive Surgery [facility], [place]

## 2016-01-19 ENCOUNTER — Encounter: Payer: BLUE CROSS/BLUE SHIELD | Attending: General Surgery

## 2016-01-19 DIAGNOSIS — Z01818 Encounter for other preprocedural examination: Secondary | ICD-10-CM | POA: Insufficient documentation

## 2016-01-19 NOTE — Progress Notes (Signed)
  Pre-Operative Nutrition Class:  Appt start time: 830   End time:  930.  Patient was seen on 01/19/2016 for Pre-Operative Bariatric Surgery [facility].   Surgery date: 02/03/2016 Surgery type: Sleeve gastrectomy Start weight at [facility]: 359 lbs on 10/15/2015 Weight today: 357.4 lbs  TANITA  BODY COMP RESULTS  01/19/16   BMI (kg/m^2) 49.8   Fat Mass (lbs) 204.4   Fat Free Mass (lbs) 153   Total Body Water (lbs) 116.6   Samples given per MNT protocol. Patient educated on appropriate usage: Premier protein shake (vanilla - qty 1) Lot #: [id number] Exp: 08/2016  Bariatric Advantage Calcium Citrate chew (caramel - qty 1) Lot #: [id number] Exp: 04/2016  Renee Pain Protein Powder (chocolate - qty 1) Lot #: [id number] Exp: 04/2017  The following the learning objectives were met by the patient during this course:  Identify Pre-Op Dietary Goals and will begin 2 weeks pre-operatively  Identify appropriate sources of fluids and proteins   State protein recommendations and appropriate sources pre and post-operatively  Identify Post-Operative Dietary Goals and will follow for 2 weeks post-operatively  Identify appropriate multivitamin and calcium sources  Describe the need for physical activity post-operatively and will follow MD recommendations  State when to call healthcare provider regarding medication questions or post-operative complications  Handouts given during class include:  Pre-Op Bariatric Surgery Diet Handout  Protein Shake Handout  Post-Op Bariatric Surgery Nutrition Handout  [facility] Program Information Flyer  Support Group Information Flyer  [facility] Bariatric Supplements Price List  Follow-Up Plan: Patient will follow-up at [facility] 2 weeks post operatively for diet advancement per MD.

## 2016-01-22 NOTE — Patient Instructions (Addendum)
Mary Olson  01/22/2016   Your procedure is scheduled on: 02/03/2016    [facility]  Entrance [place] [facility]  elevators to 3rd floor to  Short Stay Center at   0515 AM.  Call this number if you have problems the morning of surgery [phone]   Remember: ONLY 1 PERSON MAY GO WITH YOU TO SHORT STAY TO GET  READY MORNING OF YOUR SURGERY.  Do not eat food or drink liquids :After Midnight.     Take these medicines the morning of surgery with A SIP OF WATER: none                                You may not have any metal on your body including hair pins and              piercings  Do not wear jewelry, make-up, lotions, powders or perfumes, deodorant             Do not wear nail polish.  Do not shave  48 hours prior to surgery.                 Do not bring valuables to the hospital. [facility] IS NOT             RESPONSIBLE   FOR VALUABLES.  Contacts, dentures or bridgework may not be worn into surgery.  Leave suitcase in the car. After surgery it may be brought to your room.       Special Instructions: coughing and deep breathing exercises, leg exercises               Please read over the following fact sheets you were given: _____________________________________________________________________             [facility] Pre[facility] Before surgery, you can play an important role.  Because skin is not sterile, your skin needs to be as free of germs as possible.  You can reduce the number of germs on your skin by washing with CHG (chlorahexidine gluconate) soap before surgery.  CHG is an antiseptic cleaner which kills germs and bonds with the skin to continue killing germs even after washing. Please DO NOT use if you have an allergy to CHG or antibacterial soaps.  If your skin becomes reddened/irritated stop using the CHG and inform your nurse when you arrive at Short Stay. Do not shave (including legs and underarms) for at least 48 hours  prior to the first CHG shower.  You may shave your face/neck. Please follow these instructions carefully:  1.  Shower with CHG Soap the night before surgery and the  morning of Surgery.  2.  If you choose to wash your hair, wash your hair first as usual with your  normal  shampoo.  3.  After you shampoo, rinse your hair and body thoroughly to remove the  shampoo.                           4.  Use CHG as you would any other liquid soap.  You can apply chg directly  to the skin and wash                       Gently with a scrungie  or clean washcloth.  5.  Apply the CHG Soap to your body ONLY FROM THE NECK DOWN.   Do not use on face/ open                           Wound or open sores. Avoid contact with eyes, ears mouth and genitals (private parts).                       Wash face,  Genitals (private parts) with your normal soap.             6.  Wash thoroughly, paying special attention to the area where your surgery  will be performed.  7.  Thoroughly rinse your body with warm water from the neck down.  8.  DO NOT shower/wash with your normal soap after using and rinsing off  the CHG Soap.                9.  Pat yourself dry with a clean towel.            10.  Wear clean pajamas.            11.  Place clean sheets on your bed the night of your first shower and do not  sleep with pets. Day of Surgery : Do not apply any lotions/deodorants the morning of surgery.  Please wear clean clothes to the hospital/surgery center.  FAILURE TO FOLLOW THESE INSTRUCTIONS MAY RESULT IN THE CANCELLATION OF YOUR SURGERY PATIENT SIGNATURE_________________________________  NURSE SIGNATURE__________________________________  ________________________________________________________________________

## 2016-01-28 ENCOUNTER — Encounter (HOSPITAL_COMMUNITY): Payer: Self-pay

## 2016-01-28 ENCOUNTER — Encounter (HOSPITAL_COMMUNITY)
Admission: RE | Admit: 2016-01-28 | Discharge: 2016-01-28 | Disposition: A | Discharge status: Home / Self Care | Payer: BLUE CROSS/BLUE SHIELD | Source: Ambulatory Visit | Attending: General Surgery | Admitting: General Surgery

## 2016-01-28 DIAGNOSIS — Z01812 Encounter for preprocedural laboratory examination: Secondary | ICD-10-CM | POA: Diagnosis present

## 2016-01-28 DIAGNOSIS — Z6841 Body Mass Index (BMI) 40.0 and over, adult: Secondary | ICD-10-CM | POA: Insufficient documentation

## 2016-01-28 HISTORY — DX: Family history of other specified conditions: Z84.89

## 2016-01-28 HISTORY — DX: Inflammatory liver disease, unspecified: K75.9

## 2016-01-28 HISTORY — DX: Morbid (severe) obesity due to excess calories: E66.01

## 2016-01-28 LAB — COMPREHENSIVE METABOLIC PANEL
ALK PHOS: 57 U/L (ref 38–126)
ALT: 35 U/L (ref 14–54)
AST: 27 U/L (ref 15–41)
Albumin: 4.4 g/dL (ref 3.5–5.0)
Anion gap: 6 (ref 5–15)
BILIRUBIN TOTAL: 0.5 mg/dL (ref 0.3–1.2)
BUN: 15 mg/dL (ref 6–20)
CALCIUM: 9.2 mg/dL (ref 8.9–10.3)
CO2: 23 mmol/L (ref 22–32)
Chloride: 108 mmol/L (ref 101–111)
Creatinine, Ser: 0.7 mg/dL (ref 0.44–1.00)
GLUCOSE: 86 mg/dL (ref 65–99)
POTASSIUM: 4.1 mmol/L (ref 3.5–5.1)
Sodium: 137 mmol/L (ref 135–145)
TOTAL PROTEIN: 7.8 g/dL (ref 6.5–8.1)

## 2016-01-28 LAB — CBC WITH DIFFERENTIAL/PLATELET
Basophils Absolute: 0 10*3/uL (ref 0.0–0.1)
Basophils Relative: 0 %
Eosinophils Absolute: 0.2 10*3/uL (ref 0.0–0.7)
Eosinophils Relative: 3 %
HEMATOCRIT: 40.3 % (ref 36.0–46.0)
HEMOGLOBIN: 12.8 g/dL (ref 12.0–15.0)
LYMPHS ABS: 2.2 10*3/uL (ref 0.7–4.0)
Lymphocytes Relative: 47 %
MCH: 26.3 pg (ref 26.0–34.0)
MCHC: 31.8 g/dL (ref 30.0–36.0)
MCV: 82.9 fL (ref 78.0–100.0)
MONO ABS: 0.6 10*3/uL (ref 0.1–1.0)
MONOS PCT: 12 %
NEUTROS ABS: 1.8 10*3/uL (ref 1.7–7.7)
NEUTROS PCT: 38 %
Platelets: 232 10*3/uL (ref 150–400)
RBC: 4.86 MIL/uL (ref 3.87–5.11)
RDW: 15.8 % — AB (ref 11.5–15.5)
WBC: 4.7 10*3/uL (ref 4.0–10.5)

## 2016-01-28 NOTE — Pre-Procedure Instructions (Signed)
Bariatric bed with trapeze ordered- spoke with Ladene Artisterrick in [employer].

## 2016-02-03 ENCOUNTER — Inpatient Hospital Stay (HOSPITAL_COMMUNITY)
Admission: RE | Admit: 2016-02-03 | Discharge: 2016-02-04 | DRG: 621 | Disposition: A | Discharge status: Home / Self Care | Payer: BLUE CROSS/BLUE SHIELD | Source: Ambulatory Visit | Attending: General Surgery | Admitting: General Surgery

## 2016-02-03 ENCOUNTER — Encounter (HOSPITAL_COMMUNITY)
Admission: RE | Disposition: A | Discharge status: Home / Self Care | Payer: Self-pay | Source: Ambulatory Visit | Attending: General Surgery

## 2016-02-03 ENCOUNTER — Inpatient Hospital Stay (HOSPITAL_COMMUNITY): Payer: BLUE CROSS/BLUE SHIELD | Admitting: Anesthesiology

## 2016-02-03 ENCOUNTER — Encounter (HOSPITAL_COMMUNITY): Payer: Self-pay | Admitting: *Deleted

## 2016-02-03 DIAGNOSIS — E781 Pure hyperglyceridemia: Secondary | ICD-10-CM | POA: Diagnosis present

## 2016-02-03 DIAGNOSIS — G8929 Other chronic pain: Secondary | ICD-10-CM | POA: Diagnosis present

## 2016-02-03 DIAGNOSIS — M79671 Pain in right foot: Secondary | ICD-10-CM | POA: Diagnosis present

## 2016-02-03 DIAGNOSIS — R7303 Prediabetes: Secondary | ICD-10-CM | POA: Diagnosis present

## 2016-02-03 DIAGNOSIS — K449 Diaphragmatic hernia without obstruction or gangrene: Secondary | ICD-10-CM | POA: Diagnosis present

## 2016-02-03 DIAGNOSIS — K219 Gastro-esophageal reflux disease without esophagitis: Secondary | ICD-10-CM | POA: Diagnosis present

## 2016-02-03 DIAGNOSIS — M79672 Pain in left foot: Secondary | ICD-10-CM | POA: Diagnosis present

## 2016-02-03 DIAGNOSIS — Z6841 Body Mass Index (BMI) 40.0 and over, adult: Secondary | ICD-10-CM | POA: Diagnosis not present

## 2016-02-03 DIAGNOSIS — M25562 Pain in left knee: Secondary | ICD-10-CM | POA: Diagnosis present

## 2016-02-03 DIAGNOSIS — Z9884 Bariatric surgery status: Secondary | ICD-10-CM

## 2016-02-03 HISTORY — PX: LAPAROSCOPIC GASTRIC SLEEVE RESECTION WITH HIATAL HERNIA REPAIR: SHX6512

## 2016-02-03 LAB — PREGNANCY, URINE: Preg Test, Ur: NEGATIVE

## 2016-02-03 LAB — HEMOGLOBIN AND HEMATOCRIT, BLOOD
HEMATOCRIT: 38.7 % (ref 36.0–46.0)
HEMOGLOBIN: 12.4 g/dL (ref 12.0–15.0)

## 2016-02-03 SURGERY — GASTRECTOMY, SLEEVE, LAPAROSCOPIC, WITH HIATAL HERNIA REPAIR
Anesthesia: General | Site: Abdomen

## 2016-02-03 MED ORDER — SUCCINYLCHOLINE CHLORIDE 20 MG/ML IJ SOLN
INTRAMUSCULAR | Status: DC | PRN
  Administered 2016-02-03: 140 mg via INTRAVENOUS

## 2016-02-03 MED ORDER — APREPITANT 40 MG PO CAPS
40.0000 mg | ORAL_CAPSULE | ORAL | Status: AC
  Administered 2016-02-03: 40 mg via ORAL
  Filled 2016-02-03: qty 1

## 2016-02-03 MED ORDER — GLYCOPYRROLATE 0.2 MG/ML IJ SOLN
INTRAMUSCULAR | Status: AC
  Filled 2016-02-03: qty 4

## 2016-02-03 MED ORDER — SODIUM CHLORIDE 0.9 % IJ SOLN
INTRAMUSCULAR | Status: DC | PRN
  Administered 2016-02-03: 50 mL

## 2016-02-03 MED ORDER — PROMETHAZINE HCL 25 MG/ML IJ SOLN
6.2500 mg | INTRAMUSCULAR | Status: AC | PRN
  Administered 2016-02-03: 6.25 mg via INTRAVENOUS

## 2016-02-03 MED ORDER — METOCLOPRAMIDE HCL 5 MG/ML IJ SOLN
10.0000 mg | Freq: Once | INTRAMUSCULAR | Status: AC | PRN
  Administered 2016-02-03: 10 mg via INTRAVENOUS

## 2016-02-03 MED ORDER — CEFOTETAN DISODIUM-DEXTROSE 2-2.08 GM-% IV SOLR
INTRAVENOUS | Status: AC
  Filled 2016-02-03: qty 50

## 2016-02-03 MED ORDER — PROPOFOL 10 MG/ML IV BOLUS
INTRAVENOUS | Status: AC
  Filled 2016-02-03: qty 20

## 2016-02-03 MED ORDER — FAMOTIDINE IN NACL 20-0.9 MG/50ML-% IV SOLN
20.0000 mg | Freq: Two times a day (BID) | INTRAVENOUS | Status: DC
  Administered 2016-02-03 – 2016-02-04 (×3): 20 mg via INTRAVENOUS
  Filled 2016-02-03 (×4): qty 50

## 2016-02-03 MED ORDER — CEFOTETAN DISODIUM-DEXTROSE 2-2.08 GM-% IV SOLR
2.0000 g | INTRAVENOUS | Status: AC
  Administered 2016-02-03: 2 g via INTRAVENOUS

## 2016-02-03 MED ORDER — KCL IN DEXTROSE-NACL 20-5-0.45 MEQ/L-%-% IV SOLN
INTRAVENOUS | Status: DC
  Administered 2016-02-03: 1000 mL via INTRAVENOUS
  Administered 2016-02-03: 125 mL/h via INTRAVENOUS
  Administered 2016-02-04: 07:00:00 via INTRAVENOUS
  Filled 2016-02-03 (×4): qty 1000

## 2016-02-03 MED ORDER — FENTANYL CITRATE (PF) 100 MCG/2ML IJ SOLN
INTRAMUSCULAR | Status: AC
  Filled 2016-02-03: qty 2

## 2016-02-03 MED ORDER — ONDANSETRON HCL 4 MG/2ML IJ SOLN: 4.0000 mg | INTRAMUSCULAR | Status: DC | PRN

## 2016-02-03 MED ORDER — METOCLOPRAMIDE HCL 5 MG/ML IJ SOLN
INTRAMUSCULAR | Status: AC
  Filled 2016-02-03: qty 2

## 2016-02-03 MED ORDER — FENTANYL CITRATE (PF) 100 MCG/2ML IJ SOLN
INTRAMUSCULAR | Status: AC
  Administered 2016-02-03: 50 ug via INTRAVENOUS
  Filled 2016-02-03: qty 2

## 2016-02-03 MED ORDER — CHLORHEXIDINE GLUCONATE 4 % EX LIQD: 60.0000 mL | Freq: Once | CUTANEOUS | Status: DC

## 2016-02-03 MED ORDER — BUPIVACAINE LIPOSOME 1.3 % IJ SUSP
20.0000 mL | Freq: Once | INTRAMUSCULAR | Status: AC
  Administered 2016-02-03: 20 mL
  Filled 2016-02-03: qty 20

## 2016-02-03 MED ORDER — FENTANYL CITRATE (PF) 100 MCG/2ML IJ SOLN
INTRAMUSCULAR | Status: DC | PRN
  Administered 2016-02-03: 50 ug via INTRAVENOUS
  Administered 2016-02-03 (×3): 100 ug via INTRAVENOUS
  Administered 2016-02-03: 50 ug via INTRAVENOUS
  Administered 2016-02-03: 100 ug via INTRAVENOUS

## 2016-02-03 MED ORDER — HEPARIN SODIUM (PORCINE) 5000 UNIT/ML IJ SOLN
5000.0000 [IU] | INTRAMUSCULAR | Status: AC
  Administered 2016-02-03: 5000 [IU] via SUBCUTANEOUS
  Filled 2016-02-03: qty 1

## 2016-02-03 MED ORDER — LIDOCAINE HCL (CARDIAC) 20 MG/ML IV SOLN
INTRAVENOUS | Status: DC | PRN
  Administered 2016-02-03: 50 mg via INTRAVENOUS

## 2016-02-03 MED ORDER — ROCURONIUM BROMIDE 100 MG/10ML IV SOLN
INTRAVENOUS | Status: DC | PRN
  Administered 2016-02-03: 10 mg via INTRAVENOUS
  Administered 2016-02-03: 30 mg via INTRAVENOUS
  Administered 2016-02-03: 20 mg via INTRAVENOUS
  Administered 2016-02-03: 50 mg via INTRAVENOUS
  Administered 2016-02-03: 20 mg via INTRAVENOUS

## 2016-02-03 MED ORDER — ACETAMINOPHEN 160 MG/5ML PO SOLN: 325.0000 mg | ORAL | Status: DC | PRN

## 2016-02-03 MED ORDER — PROPOFOL 10 MG/ML IV BOLUS
INTRAVENOUS | Status: DC | PRN
  Administered 2016-02-03: 200 mg via INTRAVENOUS

## 2016-02-03 MED ORDER — LACTATED RINGERS IV SOLN
INTRAVENOUS | Status: DC | PRN
  Administered 2016-02-03 (×3): via INTRAVENOUS

## 2016-02-03 MED ORDER — FENTANYL CITRATE (PF) 100 MCG/2ML IJ SOLN
25.0000 ug | INTRAMUSCULAR | Status: DC | PRN
  Administered 2016-02-03 (×3): 50 ug via INTRAVENOUS

## 2016-02-03 MED ORDER — PROMETHAZINE HCL 25 MG/ML IJ SOLN
INTRAMUSCULAR | Status: AC
  Filled 2016-02-03: qty 1

## 2016-02-03 MED ORDER — OXYCODONE HCL 5 MG/5ML PO SOLN: 5.0000 mg | ORAL | Status: DC | PRN

## 2016-02-03 MED ORDER — 0.9 % SODIUM CHLORIDE (POUR BTL) OPTIME
TOPICAL | Status: DC | PRN
  Administered 2016-02-03: 1000 mL

## 2016-02-03 MED ORDER — GLYCOPYRROLATE 0.2 MG/ML IJ SOLN
INTRAMUSCULAR | Status: DC | PRN
  Administered 2016-02-03: .8 mg via INTRAVENOUS

## 2016-02-03 MED ORDER — SODIUM CHLORIDE 0.9 % IJ SOLN: INTRAMUSCULAR | Status: DC

## 2016-02-03 MED ORDER — DIPHENHYDRAMINE HCL 50 MG/ML IJ SOLN: 12.5000 mg | Freq: Three times a day (TID) | INTRAMUSCULAR | Status: DC | PRN

## 2016-02-03 MED ORDER — MIDAZOLAM HCL 5 MG/5ML IJ SOLN
INTRAMUSCULAR | Status: DC | PRN
  Administered 2016-02-03: 2 mg via INTRAVENOUS

## 2016-02-03 MED ORDER — MORPHINE SULFATE (PF) 2 MG/ML IV SOLN
2.0000 mg | INTRAVENOUS | Status: DC | PRN
  Administered 2016-02-03 (×2): 2 mg via INTRAVENOUS
  Filled 2016-02-03 (×2): qty 1

## 2016-02-03 MED ORDER — ENOXAPARIN SODIUM 30 MG/0.3ML ~~LOC~~ SOLN
30.0000 mg | Freq: Two times a day (BID) | SUBCUTANEOUS | Status: DC
  Administered 2016-02-04: 30 mg via SUBCUTANEOUS
  Filled 2016-02-03: qty 0.3

## 2016-02-03 MED ORDER — SODIUM CHLORIDE 0.9 % IJ SOLN
INTRAMUSCULAR | Status: AC
  Filled 2016-02-03: qty 10

## 2016-02-03 MED ORDER — NEOSTIGMINE METHYLSULFATE 10 MG/10ML IV SOLN
INTRAVENOUS | Status: AC
  Filled 2016-02-03: qty 1

## 2016-02-03 MED ORDER — MIDAZOLAM HCL 2 MG/2ML IJ SOLN
INTRAMUSCULAR | Status: AC
  Filled 2016-02-03: qty 2

## 2016-02-03 MED ORDER — NEOSTIGMINE METHYLSULFATE 10 MG/10ML IV SOLN
INTRAVENOUS | Status: DC | PRN
Start: 2016-02-03 — End: 2016-02-03
  Administered 2016-02-03: 5 mg via INTRAVENOUS

## 2016-02-03 MED ORDER — LACTATED RINGERS IR SOLN
Status: DC | PRN
  Administered 2016-02-03: 1000 mL

## 2016-02-03 MED ORDER — ROCURONIUM BROMIDE 100 MG/10ML IV SOLN
INTRAVENOUS | Status: AC
  Filled 2016-02-03: qty 2

## 2016-02-03 MED ORDER — PREMIER PROTEIN SHAKE: 2.0000 [oz_av] | ORAL | Status: DC

## 2016-02-03 MED ORDER — MEPERIDINE HCL 50 MG/ML IJ SOLN: 6.2500 mg | INTRAMUSCULAR | Status: DC | PRN

## 2016-02-03 MED ORDER — LACTATED RINGERS IV SOLN: INTRAVENOUS | Status: DC

## 2016-02-03 MED ORDER — ACETAMINOPHEN 10 MG/ML IV SOLN
INTRAVENOUS | Status: AC
  Administered 2016-02-03: 1000 mg via INTRAVENOUS
  Filled 2016-02-03: qty 100

## 2016-02-03 MED ORDER — BUPIVACAINE-EPINEPHRINE (PF) 0.25% -1:200000 IJ SOLN
INTRAMUSCULAR | Status: AC
  Filled 2016-02-03: qty 30

## 2016-02-03 MED ORDER — ACETAMINOPHEN 10 MG/ML IV SOLN
1000.0000 mg | Freq: Four times a day (QID) | INTRAVENOUS | Status: DC
  Administered 2016-02-03: 1000 mg via INTRAVENOUS

## 2016-02-03 MED ORDER — ACETAMINOPHEN 160 MG/5ML PO SOLN: 650.0000 mg | ORAL | Status: DC | PRN

## 2016-02-03 MED ORDER — PROMETHAZINE HCL 25 MG/ML IJ SOLN: 12.5000 mg | Freq: Four times a day (QID) | INTRAMUSCULAR | Status: DC | PRN

## 2016-02-03 SURGICAL SUPPLY — 68 items
APPLICATOR COTTON TIP 6IN STRL (MISCELLANEOUS) ×4 IMPLANT
APPLIER CLIP ROT 10 11.4 M/L (STAPLE)
BLADE SURG SZ11 CARB STEEL (BLADE) ×4 IMPLANT
CABLE HIGH FREQUENCY MONO STRZ (ELECTRODE) IMPLANT
CHLORAPREP W/TINT 26ML (MISCELLANEOUS) ×8 IMPLANT
CLIP APPLIE ROT 10 11.4 M/L (STAPLE) IMPLANT
COVER SURGICAL LIGHT HANDLE (MISCELLANEOUS) ×4 IMPLANT
DERMABOND ADVANCED (GAUZE/BANDAGES/DRESSINGS) ×2
DERMABOND ADVANCED .7 DNX12 (GAUZE/BANDAGES/DRESSINGS) ×2 IMPLANT
DEVICE SUT QUICK LOAD TK 5 (STAPLE) ×3 IMPLANT
DEVICE SUT TI-KNOT TK 5X26 (MISCELLANEOUS) ×3 IMPLANT
DEVICE SUTURE ENDOST 10MM (ENDOMECHANICALS) ×4 IMPLANT
DEVICE TI KNOT TK5 (MISCELLANEOUS) ×1
DEVICE TROCAR PUNCTURE CLOSURE (ENDOMECHANICALS) ×4 IMPLANT
DRAPE UTILITY XL STRL (DRAPES) ×8 IMPLANT
ELECT L-HOOK LAP 45CM DISP (ELECTROSURGICAL)
ELECT PENCIL ROCKER SW 15FT (MISCELLANEOUS) IMPLANT
ELECT REM PT RETURN 9FT ADLT (ELECTROSURGICAL) ×4
ELECTRODE L-HOOK LAP 45CM DISP (ELECTROSURGICAL) IMPLANT
ELECTRODE REM PT RTRN 9FT ADLT (ELECTROSURGICAL) ×2 IMPLANT
GAUZE SPONGE 4X4 12PLY STRL (GAUZE/BANDAGES/DRESSINGS) IMPLANT
GLOVE BIO SURGEON STRL SZ7.5 (GLOVE) ×4 IMPLANT
GLOVE INDICATOR 8.0 STRL GRN (GLOVE) ×4 IMPLANT
GOWN STRL REUS W/TWL XL LVL3 (GOWN DISPOSABLE) ×12 IMPLANT
GRASPER SUT TROCAR 14GX15 (MISCELLANEOUS) ×4 IMPLANT
HOVERMATT SINGLE USE (MISCELLANEOUS) ×4 IMPLANT
IRRIG SUCT STRYKERFLOW 2 WTIP (MISCELLANEOUS) ×4
IRRIGATION SUCT STRKRFLW 2 WTP (MISCELLANEOUS) ×2 IMPLANT
KIT BASIN OR (CUSTOM PROCEDURE TRAY) ×4 IMPLANT
MARKER SKIN DUAL TIP RULER LAB (MISCELLANEOUS) ×4 IMPLANT
NEEDLE SPNL 22GX3.5 QUINCKE BK (NEEDLE) ×4 IMPLANT
PACK UNIVERSAL I (CUSTOM PROCEDURE TRAY) ×4 IMPLANT
QUICK LOAD TK 5 (STAPLE) ×1
RELOAD STAPLER BLUE 60MM (STAPLE) ×4 IMPLANT
RELOAD STAPLER GOLD 60MM (STAPLE) ×4 IMPLANT
RELOAD STAPLER GREEN 60MM (STAPLE) ×4 IMPLANT
SCISSORS LAP 5X45 EPIX DISP (ENDOMECHANICALS) ×4 IMPLANT
SEALANT SURGICAL APPL DUAL CAN (MISCELLANEOUS) IMPLANT
SHEARS HARMONIC ACE PLUS 45CM (MISCELLANEOUS) ×4 IMPLANT
SLEEVE ADV FIXATION 5X100MM (TROCAR) ×8 IMPLANT
SLEEVE GASTRECTOMY 40FR VISIGI (MISCELLANEOUS) ×4 IMPLANT
SOLUTION ANTI FOG 6CC (MISCELLANEOUS) ×4 IMPLANT
SPONGE LAP 18X18 X RAY DECT (DISPOSABLE) ×4 IMPLANT
STAPLER ECHELON BIOABSB 60 FLE (MISCELLANEOUS) ×20 IMPLANT
STAPLER ECHELON LONG 60 440 (INSTRUMENTS) ×4 IMPLANT
STAPLER RELOAD BLUE 60MM (STAPLE) ×8
STAPLER RELOAD GOLD 60MM (STAPLE) ×8
STAPLER RELOAD GREEN 60MM (STAPLE) ×8
SUT MNCRL AB 4-0 PS2 18 (SUTURE) ×8 IMPLANT
SUT SURGIDAC NAB ES-9 0 48 120 (SUTURE) ×4 IMPLANT
SUT VICRYL 0 TIES 12 18 (SUTURE) ×4 IMPLANT
SYR 10ML ECCENTRIC (SYRINGE) ×4 IMPLANT
SYR 20CC LL (SYRINGE) ×3 IMPLANT
SYR 50ML LL SCALE MARK (SYRINGE) ×4 IMPLANT
SYRINGE 20CC LL (MISCELLANEOUS) ×4 IMPLANT
TOWEL OR 17X26 10 PK STRL BLUE (TOWEL DISPOSABLE) ×4 IMPLANT
TOWEL OR NON WOVEN STRL DISP B (DISPOSABLE) ×4 IMPLANT
TRAY FOLEY W/METER SILVER 14FR (SET/KITS/TRAYS/PACK) IMPLANT
TRAY FOLEY W/METER SILVER 16FR (SET/KITS/TRAYS/PACK) IMPLANT
TROCAR ADV FIXATION 5X100MM (TROCAR) ×4 IMPLANT
TROCAR BLADELESS 15MM (ENDOMECHANICALS) ×4 IMPLANT
TROCAR BLADELESS OPT 5 100 (ENDOMECHANICALS) ×4 IMPLANT
TROCAR BLADELESS OPT 5 150 (ENDOMECHANICALS) ×8 IMPLANT
TROCAR XCEL 12X100 BLDLESS (ENDOMECHANICALS) ×4 IMPLANT
TUBING CONNECTING 10 (TUBING) ×3 IMPLANT
TUBING CONNECTING 10' (TUBING) ×1
TUBING ENDO SMARTCAP (MISCELLANEOUS) ×4 IMPLANT
TUBING INSUF HEATED (TUBING) ×8 IMPLANT

## 2016-02-03 NOTE — Discharge Instructions (Addendum)
Aprepitant Discharge Instructions  °On the day of surgery you were given the medication aprepitant. This medication interacts with hormonal forms of birth control (oral contraceptives and injected or implanted birth control) and may make them ineffective. °IF YOU USE ANY HORMONAL FORM OF BIRTH CONTROL, YOU MUST USE AN ADDITIONAL BARRIER BIRTH CONTROL METHOD FOR ONE MONTH after receiving aprepitant or there is a chance you could become pregnant. ° ° ° ° °GASTRIC BYPASS/SLEEVE ° Home Care Instructions ° ° These instructions are to help you care for yourself when you go home. ° °Call: If you have any problems. °• Call [phone] and ask for the surgeon on call °• If you need immediate assistance come to the ER at [facility]. Tell the ER staff you are a new post-op gastric bypass or gastric sleeve patient  °Signs and symptoms to report: • Severe  vomiting or nausea °o If you cannot handle clear liquids for longer than 1 day, call your surgeon °• Abdominal pain which does not get better after taking your pain medication °• Fever greater than 100.4°  F and chills °• Heart rate over 100 beats a minute °• Trouble breathing °• Chest pain °• Redness,  swelling, drainage, or foul odor at incision (surgical) sites °• If your incisions open or pull apart °• Swelling or pain in calf (lower leg) °• Diarrhea (Loose bowel movements that happen often), frequent watery, uncontrolled bowel movements °• Constipation, (no bowel movements for 3 days) if this happens: °o Take Milk of Magnesia, 2 tablespoons by mouth, 3 times a day for 2 days if needed °o Stop taking Milk of Magnesia once you have had a bowel movement °o Call your doctor if constipation continues °Or °o Take Miralax  (instead of Milk of Magnesia) following the label instructions °o Stop taking Miralax once you have had a bowel movement °o Call your doctor if constipation continues °• Anything you think is “abnormal for you” °  °Normal side effects after surgery: • Unable  to sleep at night or unable to concentrate °• Irritability °• Being tearful (crying) or depressed ° °These are common complaints, possibly related to your anesthesia, stress of surgery, and change in lifestyle, that usually go away a few weeks after surgery. If these feelings continue, call your medical doctor.  °Wound Care: You may have surgical glue, steri-strips, or staples over your incisions after surgery °• Surgical glue: Looks like clear film over your incisions and will wear off a little at a time °• Steri-strips: Adhesive strips of tape over your incisions. You may notice a yellowish color on skin under the steri-strips. This is used to make the steri-strips stick better. Do not pull the steri-strips off - let them fall off °• Staples: Staples may be removed before you leave the hospital °o If you go home with staples, call [facility] for an appointment with your surgeon’s nurse to have staples removed 10 days after surgery, [phone] °• Showering: You may shower two (2) days after your surgery unless your surgeon tells you differently °o Wash gently around incisions with warm soapy water, rinse well, and gently pat dry °o If you have a drain (tube from your incision), you may need someone to hold this while you shower °o No tub baths until staples are removed and incisions are healed °  °Medications: • Medications should be liquid or crushed if larger than the size of a dime °• Extended release pills (medication that releases a little bit at   a time through the  day) should not be crushed °• Depending on the size and number of medications you take, you may need to space (take a few throughout the day)/change the time you take your medications so that you do not over-fill your pouch (smaller stomach) °• Make sure you follow-up with you primary care physician to make medication changes needed during rapid weight loss and life -style changes °• If you have diabetes, follow up with your  doctor that orders your diabetes medication(s) within one week after surgery and check your blood sugar regularly ° °• Do not drive while taking narcotics (pain medications) ° °• Do not take acetaminophen (Tylenol) and Roxicet or Lortab Elixir at the same time since these pain medications contain acetaminophen °  °Diet:  °First 2 Weeks You will see the nutritionist about two (2) weeks after your surgery. The nutritionist will increase the types of foods you can eat if you are handling liquids well: °• If you have severe vomiting or nausea and cannot handle clear liquids lasting longer than 1 day call your surgeon °Protein Shake °• Drink at least 2 ounces of shake 5-6 times per day °• Each serving of protein shakes (usually 8-12 ounces) should have a minimum of: °o 15 grams of protein °o And no more than 5 grams of carbohydrate °• Goal for protein each day: °o Men = 80 grams per day °o Women = 60 grams per day °  ° • Protein powder may be added to fluids such as non-fat milk or Lactaid milk or Soy milk (limit to 35 grams added protein powder per serving) ° °Hydration °• Slowly increase the amount of water and other clear liquids as tolerated (See Acceptable Fluids) °• Slowly increase the amount of protein shake as tolerated °• Sip fluids slowly and throughout the day °• May use sugar substitutes in small amounts (no more than 6-8 packets per day; i.e. Splenda) ° °Fluid Goal °• The first goal is to drink at least 8 ounces of protein shake/drink per day (or as directed by the nutritionist); some examples of protein shakes are Syntrax Nectar, Adkins Advantage, EAS Edge HP, and Unjury. - See handout from pre-op Bariatric Education Class: °o Slowly increase the amount of protein shake you drink as tolerated °o You may find it easier to slowly sip shakes throughout the day °o It is important to get your proteins in first °• Your fluid goal is to drink 64-100 ounces of fluid daily °o It may take a few weeks to build up to  this  °• 32 oz. (or more) should be clear liquids °And °• 32 oz. (or more) should be full liquids (see below for examples) °• Liquids should not contain sugar, caffeine, or carbonation ° °Clear Liquids: °• Water of Sugar-free flavored water (i.e. Fruit H²O, Propel) °• Decaffeinated coffee or tea (sugar-free) °• Crystal lite, Wyler’s Lite, Minute Maid Lite °• Sugar-free Jell-O °• Bouillon or broth °• Sugar-free Popsicle:    - Less than 20 calories each; Limit 1 per day ° °Full Liquids: °                  Protein Shakes/Drinks + 2 choices per day of other full liquids °• Full liquids must be: °o No More Than 12 grams of Carbs per serving °o No More Than 3 grams of Fat per serving °• Strained low-fat cream soup °• Non-Fat milk °• Fat-free Lactaid Milk °• Sugar-free yogurt (Dannon Lite & Fit, Greek   yogurt) ° °  °Vitamins and Minerals • Start 1 day after surgery unless otherwise directed by your surgeon °• 2 Chewable Multivitamin / Multimineral Supplement with iron (i.e. Centrum for Adults) °• Vitamin B-12, 350-500 micrograms sub-lingual (place tablet under the tongue) each day °• Chewable Calcium Citrate with Vitamin D-3 °(Example: 3 Chewable Calcium  Plus 600 with Vitamin D-3) °o Take 500 mg three (3) times a day for a total of 1500 mg each day °o Do not take all 3 doses of calcium at one time as it may cause constipation, and you can only absorb 500 mg at a time °o Do not mix multivitamins containing iron with calcium supplements;  take 2 hours apart °o Do not substitute Tums (calcium carbonate) for your calcium °• Menstruating women and those at risk for anemia ( a blood disease that causes weakness) may need extra iron °o Talk to your doctor to see if you need more iron °• If you need extra iron: Total daily Iron recommendation (including Vitamins) is 50 to 100 mg Iron/day °• Do not stop taking or change any vitamins or minerals until you talk to your nutritionist or surgeon °• Your nutritionist and/or surgeon must  approve all vitamin and mineral supplements °  °Activity and Exercise: It is important to continue walking at home. Limit your physical activity as instructed by your doctor. During this time, use these guidelines: °• Do not lift anything greater than ten  (10) pounds for at least two (2) weeks °• Do not go back to work or drive until your surgeon says you can °• You may have sex when you feel comfortable °o It is VERY important for female patients to use a reliable birth control method; fertility often increase after surgery °o Do not get pregnant for at least 18 months °• Start exercising as soon as your doctor tells you that you can °o Make sure your doctor approves any physical activity °• Start with a simple walking program °• Walk 5-15 minutes each day, 7 days per week °• Slowly increase until you are walking 30-45 minutes per day °• Consider joining our BELT program. [phone] or email [email or web address] °  °Special Instructions Things to remember: °• Free counseling is available for you and your family through collaboration between  and [employer]. Please call [phone] and leave a message °• Use your CPAP when sleeping if this applies to you °• Consider buying a medical alert bracelet that says you had lap-band surgery °  °  You will likely have your first fill (fluid added to your band) 6 - 8 weeks after surgery °• [facility] has a free Bariatric Surgery Support Group that meets monthly, the 3rd Thursday, 6pm. [facility] Classrooms. You can see classes online at [email or web address] °• It is very important to keep all follow up appointments with your surgeon, nutritionist, primary care physician, and behavioral health practitioner °o After the first year, please follow up with your bariatric surgeon and nutritionist at least once a year in order to maintain best weight loss results °      °             [facility]:  [phone] ° °             [facility]: [phone] ° °             Bariatric Nurse Coordinator: [phone]  °Gastric Bypass/Sleeve Home Care   Instructions  Rev. 07/2012    ° °                                                    Reviewed and Endorsed °                                                   by [facility] Patient Education Committee, Jan, 2014 ° ° ° ° ° ° ° ° ° °

## 2016-02-03 NOTE — Anesthesia Procedure Notes (Signed)
Procedure Name: Intubation Performed by: Reginia Battie J Pre-anesthesia Checklist: Patient identified, Emergency Drugs available, Suction available, Patient being monitored and Timeout performed Patient Re-evaluated:Patient Re-evaluated prior to inductionOxygen Delivery Method: Circle system utilized Preoxygenation: Pre-oxygenation with 100% oxygen Intubation Type: IV induction Ventilation: Mask ventilation without difficulty Laryngoscope Size: Mac and 4 Grade View: Grade I Tube type: Oral Tube size: 7.0 mm Number of attempts: 1 Airway Equipment and Method: Stylet Placement Confirmation: ETT inserted through vocal cords under direct vision,  positive ETCO2,  CO2 detector and breath sounds checked- equal and bilateral Secured at: 21 cm Tube secured with: Tape Dental Injury: Teeth and Oropharynx as per pre-operative assessment        

## 2016-02-03 NOTE — Interval H&P Note (Signed)
History and Physical Interval Note:  02/03/2016 7:12 AM  Mary Olson  has presented today for surgery, with the diagnosis of MORBID OBESITY  The various methods of treatment have been discussed with the patient and family. After consideration of risks, benefits and other options for treatment, the patient has consented to  Procedure(s): LAPAROSCOPIC GASTRIC SLEEVE RESECTION (N/A) as a surgical intervention .  The patient's history has been reviewed, patient examined, no change in status, stable for surgery.  I have reviewed the patient's chart and labs.  Questions were answered to the patient's satisfaction.    Mary SellaEric M. Andrey CampanileWilson, MD, FACS General, Bariatric, & Minimally Invasive Surgery [facility] [place]   [facility] BrooklynWILSON,Olin Gurski M

## 2016-02-03 NOTE — H&P (View-Only) (Signed)
Mary Olson 01/15/2016 8:54 AM Location: [facility] Patient #: [id number] DOB: 1986-04-16 Single / Language: Undefined / Race: Undefined Female  History of Present Illness [name] M. Haley Roza MD; 01/15/2016 9:26 AM) The patient is a 30 year old female who presents for a pre-op visit. She comes in today for her preoperative visit. She has been approved by her insurance coming for laparoscopic sleeve gastrectomy. I initially met her on April 5. Her weight at that time was 358 pounds with a BMI of 49.9. She denies any medical changes since her initial visit. She denies any trips to the emergency room or hospital. She denies any tobacco use. The only thing she states that is a little bit different is that she is having more reflux episodes especially if she drinks a citrus beverage. She denies any chest pain, chest pressure, shortness of breath, dyspnea on exertion, orthopnea, abdominal pain, nausea, vomiting, diarrhea, constipation, melena or hematochezia.  She has been evaluated by the nutritionist and psychologist with no red flags.   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 Fish Oil (1000MG Capsule, Oral) Active. Multi Vitamin (Oral) Active. Flonase (50MCG/ACT  Suspension, Nasal) Active. ZyrTEC (10MG Tablet, Oral) Active. Medications Reconciled OxyCODONE HCl (5MG/5ML Solution, 5-10 Milliliter Oral every four hours, as needed, Taken starting 01/15/2016) Active. Zofran ODT (4MG Tablet Disint, 1 (one) Tablet Disperse Oral every six hours, as needed, Taken starting 01/15/2016) Active. Protonix (40MG Tablet DR, 1 (one) Tablet Oral daily, Taken starting 01/15/2016) Active.  Social History [name], MD; 01/15/2016 9:24 AM) Tobacco use Never smoker. Alcohol use Occasional alcohol use. Caffeine use Coffee. No drug use  Family History [name], MD; 01/15/2016 9:24 AM) Anesthetic complications Mother. Cerebrovascular Accident Family Members In General. Colon Polyps Father. Hypertension Mother. Respiratory Condition Father. Diabetes Mellitus Father. Heart Disease Family Members In General. Heart disease in female family member before age 53  Pregnancy / Birth History [name], MD; 01/15/2016 9:24 AM) Age at menarche 27 years. Contraceptive History Oral contraceptives. Gravida 0 Irregular periods Para 0     Review of Systems [name] M. Zeinab Rodwell MD; 01/15/2016 9:26 AM) General Present- Fatigue and Weight Gain. Not Present- Appetite Loss, Chills, Fever, Night Sweats and Weight Loss. Skin Present- Dryness. Not Present- Change in Wart/Mole, Hives, Jaundice, New Lesions, Non-Healing Wounds, Rash and Ulcer. HEENT Present- Seasonal Allergies and Wears glasses/contact lenses. Not Present- Earache, Hearing Loss, Hoarseness, Nose Bleed, Oral Ulcers, Ringing in the Ears, Sinus Pain, Sore Throat, Visual Disturbances and Yellow Eyes. Respiratory Present- Snoring. Not Present- Bloody sputum, Chronic Cough, Difficulty Breathing and Wheezing. Breast Not Present- Breast Mass, Breast Pain, Nipple Discharge and Skin Changes. Cardiovascular Not Present- Chest Pain, Difficulty Breathing Lying Down, Leg Cramps, Palpitations, Rapid Heart  Rate, Shortness of Breath and Swelling of Extremities. Gastrointestinal Not Present- Abdominal Pain, Bloating, Bloody Stool, Change in Bowel Habits, Chronic diarrhea,  Constipation, Difficulty Swallowing, Excessive gas, Gets full quickly at meals, Hemorrhoids, Indigestion, Nausea, Rectal Pain and Vomiting. Female Genitourinary Not Present- Frequency, Nocturia, Painful Urination, Pelvic Pain and Urgency. Musculoskeletal Not Present- Back Pain, Joint Pain, Joint Stiffness, Muscle Pain, Muscle Weakness and Swelling of Extremities. Neurological Not Present- Decreased Memory, Fainting, Headaches, Numbness, Seizures, Tingling, Tremor, Trouble walking and Weakness. Psychiatric Present- Anxiety. Not Present- Bipolar, Change in Sleep Pattern, Depression, Fearful and Frequent crying. Endocrine Present- Excessive Hunger. Not Present- Cold Intolerance, Hair Changes, Heat Intolerance, Hot flashes and New Diabetes. Hematology Not Present- Easy Bruising, Excessive bleeding, Gland problems, HIV and Persistent Infections.  Vitals (Sonya Bynum CMA; 01/15/2016 8:55 AM) 01/15/2016 8:54 AM Weight: 356 lb Height: 71in Body Surface Area: 2.69 m Body Mass Index: 49.65 kg/m  Temp.: 37F(Temporal)  Pulse: 82 (Regular)  BP: 130/84 (Sitting, Left Arm, Standard)      Physical Exam [name] M. Sharad Vaneaton MD; 01/15/2016 9:26 AM)  General Mental Status-Alert. General Appearance-Consistent with stated age. Hydration-Well hydrated. Voice-Normal. Note: morbidly obese, evenly distributed  Head and Neck Head-normocephalic, atraumatic with no lesions or palpable masses. Trachea-midline. Thyroid Gland Characteristics - normal size and consistency.  Eye Eyeball - Bilateral-Extraocular movements intact. Sclera/Conjunctiva - Bilateral-No scleral icterus.  Chest and Lung Exam Chest and lung exam reveals -quiet, even and easy respiratory effort with no use of accessory muscles and on auscultation,  normal breath sounds, no adventitious sounds and normal vocal resonance. Inspection Chest Wall - Normal. Back - normal.  Breast - Did not examine.  Cardiovascular Cardiovascular examination reveals -normal heart sounds, regular rate and rhythm with no murmurs and normal pedal pulses bilaterally.  Abdomen Inspection Inspection of the abdomen reveals - No Hernias. Skin - Scar - no surgical scars. Palpation/Percussion Palpation and Percussion of the abdomen reveal - Soft, Non Tender, No Rebound tenderness, No Rigidity (guarding) and No hepatosplenomegaly. Auscultation Auscultation of the abdomen reveals - Bowel sounds normal.  Peripheral Vascular Upper Extremity Palpation - Pulses bilaterally normal.  Neurologic Neurologic evaluation reveals -alert and oriented x 3 with no impairment of recent or remote memory. Mental Status-Normal.  Neuropsychiatric The patient's mood and affect are described as -normal. Judgment and Insight-insight is appropriate concerning matters relevant to self.  Musculoskeletal Normal Exam - Left-Upper Extremity Strength Normal and Lower Extremity Strength Normal. Normal Exam - Right-Upper Extremity Strength Normal and Lower Extremity Strength Normal. Note: Left knee crepitus  Lymphatic Head & Neck  General Head & Neck Lymphatics: Bilateral - Description - Normal. Axillary - Did not examine. Femoral & Inguinal - Did not examine.    Assessment & Plan [name] M. Desman Polak MD; 01/15/2016 9:24 AM)  Virgina Evener OBESITY WITH BMI OF 45.0-49.9, ADULT (E66.01) Impression: We reviewed her preoperative workup. Her upper GI was within normal limits there is no evidence of a hiatal hernia however she states that she is having some more frequent reflux. I explained that we would evaluate her for a hiatal hernia intraoperatively and repaired if found. We discussed with that would entail. We discussed the results of her bariatric evaluation labs. Most of them  were all normal. We discussed the importance of the preoperative diet plan. She has her preoperative class on Monday. She was given her postoperative pain, heartburn, and nausea prescriptions today. We discussed the typical postoperative course. We discussed the typical postoperative diet progression as well. All of her questions were addressed and answered. I encouraged her to contact the office if she had any questions between now and surgery  Current Plans Pt  Education - EMW_preopbariatric Started OxyCODONE HCl 5MG/5ML, 5-10 Milliliter every four hours, as needed, 200 Milliliter, 01/15/2016, No Refill. Started Zofran ODT 4MG, 1 (one) Tablet Disperse every six hours, as needed, #20, 01/15/2016, No Refill. Started Protonix 40MG, 1 (one) Tablet daily, #30, 30 days starting 01/15/2016, Ref. x3. 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 HDL level 43.  PREDIABETES (R73.03) Impression: Her initial evaluation labs showed a hemoglobin A1c of 5.7. We will monitor this over time  Leighton Ruff. Redmond Pulling, MD, FACS General, Bariatric, & Minimally Invasive Surgery [facility], [place]

## 2016-02-03 NOTE — Op Note (Signed)
02/03/2016 Mary HerterAnny Gomez-Nunez 04/20/1986 [id number]   PRE-OPERATIVE DIAGNOSIS:    Morbid obesity BMI 48   Prediabetes   Hypertriglyceridemia   GERD (gastroesophageal reflux disease)   Chronic pain of left knee   Bilateral foot pain  POST-OPERATIVE DIAGNOSIS:  Same + hiatal hernia  PROCEDURE:  Procedure(s): LAPAROSCOPIC SLEEVE GASTRECTOMY WITH HIATAL HERNIA REPAIR UPPER GI ENDOSCOPY  SURGEON:  Surgeon(s): Atilano InaEric M Authur Cubit, MD FACS FASMBS  ASSISTANTS: Ovidio Kinavid Newman MD FACS  ANESTHESIA:   general  DRAINS: none   BOUGIE: 40 fr ViSiGi  LOCAL MEDICATIONS USED:  MARCAINE + Exparel  SPECIMEN:  Source of Specimen:  Greater curvature of stomach  DISPOSITION OF SPECIMEN:  PATHOLOGY  COUNTS:  YES  INDICATION FOR PROCEDURE: This is a very pleasant 30 y.o.-year-old morbidly obese female who has had unsuccessful attempts for sustained weight loss. The patient presents today for a planned laparoscopic sleeve gastrectomy with upper endoscopy. We have discussed the risk and benefits of the procedure extensively preoperatively. Please see my separate notes.  PROCEDURE: After obtaining informed consent and receiving 5000 units of subcutaneous heparin, the patient [facility] and placed supine on the operating room table. General endotracheal anesthesia was established. Sequential compression devices were placed. A orogastric tube was placed. The patient's abdomen was prepped and draped in the usual standard surgical fashion. The patient received preoperative IV antibiotic. A surgical timeout was performed.  Access to the abdomen was achieved using a 5 mm 0 laparoscope thru a 5 mm trocar In the left upper Quadrant 2 fingerbreadths below the left subcostal margin using the Optiview technique. Pneumoperitoneum was smoothly established up to 15 mm of mercury. The laparoscope was advanced and the abdominal cavity was surveilled. The patient was then placed in  reverse Trendelenburg.   A 5 mm trocar was placed slightly above and to the left of the umbilicus under direct visualization.  [facility] liver retractor was placed under the left lobe of the liver through a 5 mm trocar incision site in the subxiphoid position. A 5 mm trocar was placed in the lateral right upper quadrant along with a 15 mm trocar in the mid right abdomen. A final 5 mm trocar was placed in the lateral LUQ.  All under direct visualization after local had been infiltrated. We did use some 150 length trocars due to the width of her abdominal wall.   The stomach was inspected. It was completely decompressed and the orogastric tube was removed.  There was a small anterior dimple that was obviously visible. Her preop UGI showed no hiatal hernia but she does have reflux so I decided to test for one. The calibration tube was placed in the oropharynx and guided down into the stomach by the CRNA. 10 mL of air was insufflated into the calibration balloon. The calibration tubing was then gently pulled back by the CRNA and it slid past the GE junction. At this point the calibration tubing was desufflated and pulled back into the esophagus. This confirmed my suspicion of a clinically significant hiatal hernia. Her central truncal pannus created a fair amount of torque on the camera trocar and on the 15mm trocar in the right mid abdomen. Therefore I placed a 12mm trocar several inches more superior to the 15 mm trocar. This provided easier access/mobility to the foregut.  The gastrohepatic ligament was incised with harmonic scalpel. The right crus was identified. We identified the crossing fat along the right crus. The adipose tissue  just above this area was incised with harmonic scalpel. I then bluntly dissected out this area and identified the left crus. There was evidence of a hiatal hernia. I then mobilized the esophagus. The left and right crus were further mobilized with blunt dissection. I was then  able to reapproximate the left and right crus with 0 Ethibond using an Endostitch suture device and securing it with a titanium tyknot. We then had the CRNA readvanced the calibration tubing back into the stomach. 10 mL of air was insufflated into the calibration tube balloon. The calibration tube was then gently pulled back and there was resistance at the GE junction. The tube did not slide back up into the esophagus. At this point the calibration tubing was deflated and removed from the patient's body.   We identified the pylorus and measured 6 cm proximal to the pylorus and identified an area of where we would start taking down the short gastric vessels. Harmonic scalpel was used to take down the short gastric vessels along the greater curvature of the stomach. We were able to enter the lesser sac. We continued to march along the greater curvature of the stomach taking down the short gastrics. As we approached the gastrosplenic ligament we took care in this area not to injure the spleen. We were able to take down the entire gastrosplenic ligament. We then mobilized the fundus away from the left crus of diaphragm. There were not any significant posterior gastric avascular attachments. This left the stomach completely mobilized. No vessels had been taken down along the lesser curvature of the stomach.  We then reidentified the pylorus. A 40Fr ViSiGi was then placed in the oropharynx and advanced down into the stomach and placed in the distal antrum and positioned along the lesser curvature. It was placed under suction which secured the 40Fr ViSiGi in place along the lesser curve. Then using the Ethicon echelon 60 mm stapler with a green load with Seamguard, I placed a stapler along the antrum approximately 5 cm from the pylorus. The stapler was angled so that there is ample room at the angularis incisura. I then fired the first staple load after inspecting it posteriorly to ensure adequate space both  anteriorly and posteriorly. At this point I still was not completely past the angularis so with another green load with Seamguard, I placed the stapler in position just inside the prior stapleline. We then rotated the stomach to insure that there was adequate anteriorly as well as posteriorly. The stapler was then fired. At this point I started using 60 mm gold load staple cartridges with Seamguard. The echelon stapler was then repositioned with a 60 mm gold load with Seamguard and we continued to march up along the ViSiGi. My assistant was holding traction along the greater curvature stomach along the cauterized short gastric vessels ensuring that the stomach was symmetrically retracted. Prior to each firing of the staple, we rotated the stomach to ensure that there is adequate stomach left.  As we approached the fundus, I used 60 mm blue cartridge with Seamguard aiming slightly lateral to the esophageal fat pad.  The sleeve was inspected. There is no evidence of cork screw. The staple line appeared hemostatic. The CRNA inflated the ViSiGi to the green zone and the upper abdomen was flooded with saline. There were no bubbles. The sleeve was decompressed and the ViSiGi removed. My assistant scrubbed out and performed an upper endoscopy. The sleeve easily distended with air and the scope was easily  advanced to the pylorus. There is no evidence of internal bleeding or cork screwing. There was no narrowing at the angularis. There is no evidence of bubbles. There was a little redundancy at the top of the sleeve.  Please see his operative note for further details. The gastric sleeve was decompressed and the endoscope was removed.  The greater curvature the stomach was grasped with a laparoscopic grasper and removed from the 15 mm trocar site.  The liver retractor was removed. I then closed the 15 mm trocar site with 2 interrupted 0 Vicryl sutures through the fascia using the endoclose. The closure was viewed  laparoscopically and it was airtight. 70 cc of Exparel was then infiltrated in the preperitoneal spaces around the trocar sites. Pneumoperitoneum was released. All trocar sites were closed with a 4-0 Monocryl in a subcuticular fashion followed by the application of Dermabond. The patient was extubated and taken to the recovery room in stable condition. All needle, instrument, and sponge counts were correct x2. There are no immediate complications  (2) 60 mm green with Seamguard (2) 60 mm gold with seamguard (1) 60 mm blue with seamguard  PLAN OF CARE: Admit to inpatient   PATIENT DISPOSITION:  PACU - hemodynamically stable.   Delay start of Pharmacological VTE agent (>24hrs) due to surgical blood loss or risk of bleeding:  no  Mary SellaEric M. Andrey CampanileWilson, MD, FACS FASMBS General, Bariatric, & Minimally Invasive Surgery [facility], [place]

## 2016-02-03 NOTE — Transfer of Care (Signed)
Immediate Anesthesia Transfer of Care Note  Patient: Belenda Gomez-Nunez  Procedure(s) Performed: Procedure(s): LAPAROSCOPIC GASTRIC SLEEVE RESECTION WITH HIATAL HERNIA REPAIR WITH UPPER ENDOSCOPY  Patient Location: PACU  Anesthesia Type:General  Level of Consciousness: awake, alert  and oriented  Airway & Oxygen Therapy: Patient Spontanous Breathing and Patient connected to face mask oxygen  Post-op Assessment: Report given to RN and Post -op Vital signs reviewed and stable  Post vital signs: Reviewed and stable  Last Vitals:  Vitals:   02/03/16 0527  BP: (!) 149/90  Pulse: 86  Resp: 18  Temp: 36.7 C    Last Pain:  Vitals:   02/03/16 0527  TempSrc: Oral      Patients Stated Pain Goal: 5 (02/03/16 0547)  Complications: No apparent anesthesia complications

## 2016-02-03 NOTE — Anesthesia Preprocedure Evaluation (Addendum)
Anesthesia Evaluation  Patient identified by MRN, date of birth, ID band Patient awake    Reviewed: Allergy & Precautions, NPO status , Patient's Chart, lab work & pertinent test results  Airway Mallampati: I  TM Distance: >3 FB Neck ROM: Full    Dental no notable dental hx.    Pulmonary neg pulmonary ROS,    Pulmonary exam normal breath sounds clear to auscultation       Cardiovascular negative cardio ROS Normal cardiovascular exam Rhythm:Regular Rate:Normal     Neuro/Psych negative neurological ROS  negative psych ROS   GI/Hepatic negative GI ROS, Neg liver ROS,   Endo/Other  Morbid obesity  Renal/GU negative Renal ROS  negative genitourinary   Musculoskeletal negative musculoskeletal ROS (+)   Abdominal   Peds negative pediatric ROS (+)  Hematology negative hematology ROS (+)   Anesthesia Other Findings   Reproductive/Obstetrics negative OB ROS                            Anesthesia Physical Anesthesia Plan  ASA: III  Anesthesia Plan: General   Post-op Pain Management:    Induction: Intravenous  Airway Management Planned: Oral ETT  Additional Equipment:   Intra-op Plan:   Post-operative Plan: Extubation in OR  Informed Consent: I have reviewed the patients History and Physical, chart, labs and discussed the procedure including the risks, benefits and alternatives for the proposed anesthesia with the patient or authorized representative who has indicated his/her understanding and acceptance.   Dental advisory given  Plan Discussed with: CRNA  Anesthesia Plan Comments:         Anesthesia Quick Evaluation

## 2016-02-03 NOTE — Anesthesia Postprocedure Evaluation (Signed)
Anesthesia Post Note  Patient: Mary Olson  Procedure(s) Performed: Procedure(s): LAPAROSCOPIC GASTRIC SLEEVE RESECTION WITH HIATAL HERNIA REPAIR WITH UPPER ENDOSCOPY  Patient location during evaluation: PACU Anesthesia Type: General Level of consciousness: awake and alert Pain management: pain level controlled Vital Signs Assessment: post-procedure vital signs reviewed and stable Respiratory status: spontaneous breathing, nonlabored ventilation, respiratory function stable and patient connected to nasal cannula oxygen Cardiovascular status: blood pressure returned to baseline and stable Postop Assessment: no signs of nausea or vomiting Anesthetic complications: no    Last Vitals:  Vitals:   02/03/16 1015 02/03/16 1030  BP: (!) 154/92 (!) 151/75  Pulse: 79 76  Resp: (!) 23 20  Temp:      Last Pain:  Vitals:   02/03/16 1015  TempSrc:   PainSc: 6                  Phillips Groutarignan, Keiji Melland

## 2016-02-03 NOTE — Op Note (Signed)
Name:  Mary Olson MRN: [id number] Date of Surgery: 02/03/2016  Preop Diagnosis:  Morbid Obesity  Postop Diagnosis:  Morbid Obesity (Weight - 356, BMI - 49.65), S/P Gastric Sleeve  Procedure:  Upper endoscopy  (Intraoperative)  Surgeon:  Ovidio Kinavid Doris Gruhn, M.D.  Anesthesia:  GET  Indications for procedure: Mary Olson is a 30 y.o. female whose primary care physician is NORWOOD, DOROTHY, MD and has completed a Gastric Sleeve today by Dr. Andrey CampanileWilson.  I am doing an intraoperative upper endoscopy to evaluate the gastric pouch.  Operative Note: The patient is under general anesthesia.  Dr. Andrey CampanileWilson is laparoscoping the patient while I do an upper endoscopy to evaluate the stomach pouch.  With the patient intubated, I passed the Pentax upper endoscope without difficulty down the esophagus.  The esophago-gastric junction was at 39 cm.    The mucosa of the stomach looked viable and the staple line was intact without bleeding.  I advanced to the pylorus, but did not go through it.  While I insufflated the stomach pouch with air, Dr. Andrey CampanileWilson  flooded the upper abdomen with saline to put the gastric pouch under saline.  There was no bubbling or evidence of a leak.  There was no evidence of narrowing of the pouch and the gastric sleeve looked tubular.  The scope was then withdrawn.  The esophagus was unremarkable and the patient tolerated the endoscopy without difficulty.  Ovidio Kinavid Islam Eichinger, MD, [facility] Pager: [phone] Office phone:  [phone]

## 2016-02-04 ENCOUNTER — Encounter (HOSPITAL_COMMUNITY): Payer: Self-pay | Admitting: General Surgery

## 2016-02-04 LAB — CBC WITH DIFFERENTIAL/PLATELET
BASOS PCT: 0 %
Basophils Absolute: 0 10*3/uL (ref 0.0–0.1)
EOS ABS: 0 10*3/uL (ref 0.0–0.7)
EOS PCT: 0 %
HCT: 37.9 % (ref 36.0–46.0)
HEMOGLOBIN: 12.1 g/dL (ref 12.0–15.0)
LYMPHS ABS: 1.4 10*3/uL (ref 0.7–4.0)
Lymphocytes Relative: 16 %
MCH: 26.2 pg (ref 26.0–34.0)
MCHC: 31.9 g/dL (ref 30.0–36.0)
MCV: 82.2 fL (ref 78.0–100.0)
MONO ABS: 0.8 10*3/uL (ref 0.1–1.0)
MONOS PCT: 10 %
NEUTROS PCT: 74 %
Neutro Abs: 6.3 10*3/uL (ref 1.7–7.7)
Platelets: 215 10*3/uL (ref 150–400)
RBC: 4.61 MIL/uL (ref 3.87–5.11)
RDW: 15.9 % — AB (ref 11.5–15.5)
WBC: 8.5 10*3/uL (ref 4.0–10.5)

## 2016-02-04 LAB — COMPREHENSIVE METABOLIC PANEL
ALBUMIN: 3.9 g/dL (ref 3.5–5.0)
ALK PHOS: 51 U/L (ref 38–126)
ALT: 35 U/L (ref 14–54)
ANION GAP: 6 (ref 5–15)
AST: 39 U/L (ref 15–41)
BUN: 7 mg/dL (ref 6–20)
CALCIUM: 9.1 mg/dL (ref 8.9–10.3)
CO2: 23 mmol/L (ref 22–32)
Chloride: 107 mmol/L (ref 101–111)
Creatinine, Ser: 0.77 mg/dL (ref 0.44–1.00)
GFR calc non Af Amer: >60 mL/min (ref >60)
GLUCOSE: 122 mg/dL — AB (ref 65–99)
POTASSIUM: 4 mmol/L (ref 3.5–5.1)
SODIUM: 136 mmol/L (ref 135–145)
TOTAL PROTEIN: 7.1 g/dL (ref 6.5–8.1)
Total Bilirubin: 0.2 mg/dL — ABNORMAL LOW (ref 0.3–1.2)

## 2016-02-04 LAB — HEMOGLOBIN AND HEMATOCRIT, BLOOD
HEMATOCRIT: 38 % (ref 36.0–46.0)
Hemoglobin: 12.2 g/dL (ref 12.0–15.0)

## 2016-02-04 MED ORDER — OXYCODONE HCL 5 MG/5ML PO SOLN: 5.0000 mg | ORAL | 0 refills | Status: AC | PRN

## 2016-02-04 MED ORDER — SIMETHICONE 40 MG/0.6ML PO SUSP
40.0000 mg | Freq: Four times a day (QID) | ORAL | Status: DC | PRN
  Administered 2016-02-04: 40 mg via ORAL
  Filled 2016-02-04 (×2): qty 0.6

## 2016-02-04 NOTE — Progress Notes (Signed)
1 Day Post-Op  Subjective: Didn't sleep well - normally sleeps on stomach. Not much nausea. More burping/gas pain. Ambulated numerous times.   Objective: Vital signs in last 24 hours: Temp:  [97.6 F (36.4 C)-98.8 F (37.1 C)] 97.8 F (36.6 C) (08/23 0539) Pulse Rate:  [70-107] 100 (08/23 0539) Resp:  [8-28] 17 (08/23 0539) BP: (127-158)/(65-92) 140/72 (08/23 0539) SpO2:  [96 %-100 %] 100 % (08/23 0539) Last BM Date: 02/02/16  Intake/Output from previous day: 08/22 0701 - 08/23 0700 In: 4789.6 [I.V.:4639.6; IV Piggyback:150] Out: 3525 [Urine:3500; Blood:25] Intake/Output this shift: Total I/O In: 60 [P.O.:60] Out: -   Alert, just back from walk, nontoxic cta Reg Soft, obese, incisions c/d/i, mild apprp TTP No edema  Lab Results:   Recent Labs  02/03/16 1044 02/04/16 0453  WBC  --  8.5  HGB 12.4 12.1  HCT 38.7 37.9  PLT  --  215   BMET  Recent Labs  02/04/16 0453  NA 136  K 4.0  CL 107  CO2 23  GLUCOSE 122*  BUN 7  CREATININE 0.77  CALCIUM 9.1   PT/INR No results for input(s): LABPROT, INR in the last 72 hours. ABG No results for input(s): PHART, HCO3 in the last 72 hours.  Invalid input(s): PCO2, PO2  Studies/Results: No results found.  Anti-infectives: Anti-infectives    Start     Dose/Rate Route Frequency Ordered Stop   02/03/16 0529  cefoTEtan in Dextrose 5% (CEFOTAN) IVPB 2 g     2 g Intravenous On call to O.R. 02/03/16 0529 02/03/16 0751      Assessment/Plan: s/p Procedure(s): LAPAROSCOPIC GASTRIC SLEEVE RESECTION WITH HIATAL HERNIA REPAIR WITH UPPER ENDOSCOPY  Principal Problem:   Morbid obesity (HCC) Active Problems:   Prediabetes   Hypertriglyceridemia   GERD (gastroesophageal reflux disease)   Chronic pain of left knee   Bilateral foot pain   S/P laparoscopic sleeve gastrectomy  Looks good. Cbc ok. Will monitor HR. Believe she is safe to start POD 1 diet.  Simethicone Cont chemical vte prophylaxis Will montior PO  intake and vitals. Will reassess later today for candidacy for dc  Mary Sellaric M. Andrey CampanileWilson, MD, FACS General, Bariatric, & Minimally Invasive Surgery [facility], [place]   LOS: 1 day    Atilano InaWILSON,Greg Eckrich M 02/04/2016

## 2016-02-04 NOTE — Plan of Care (Signed)
Problem: Food- and Nutrition-Related Knowledge Deficit (NB-1.1) Goal: Nutrition education Formal process to instruct or train a patient/client in a skill or to impart knowledge to help patients/clients voluntarily manage or modify food choices and eating behavior to maintain or improve health. Outcome: Completed/Met Date Met: 02/04/16 Nutrition Education Note  Received consult for diet education per DROP protocol.   Discussed 2 week post op diet with pt. Emphasized that liquids must be non carbonated, non caffeinated, and sugar free. Fluid goals discussed. Reviewed progression of diet to include soft proteins at 7-10 days post-op. Pt to follow up with outpatient bariatric RD for further diet progression after 2 weeks. Multivitamins and minerals also reviewed. Teach back method used, pt expressed understanding, expect good compliance.   Diet: First 2 Weeks  You will see the dietitian about two (2) weeks after your surgery. The dietitian will increase the types of foods you can eat if you are handling liquids well:  If you have severe vomiting or nausea and cannot handle clear liquids lasting longer than 1 day, call your surgeon  Protein Shake  Drink at least 2 ounces of shake 5-6 times per day  Each serving of protein shakes (usually 8 - 12 ounces) should have a minimum of:  15 grams of protein  And no more than 5 grams of carbohydrate  Goal for protein each day:  Men = 80 grams per day  Women = 60 grams per day  Protein powder may be added to fluids such as non-fat milk or Lactaid milk or Soy milk (limit to 35 grams added protein powder per serving)   Hydration  Slowly increase the amount of water and other clear liquids as tolerated (See Acceptable Fluids)  Slowly increase the amount of protein shake as tolerated  Sip fluids slowly and throughout the day  May use sugar substitutes in small amounts (no more than 6 - 8 packets per day; i.e. Splenda)   Fluid Goal  The first goal is to  drink at least 8 ounces of protein shake/drink per day (or as directed by the nutritionist); some examples of protein shakes are [employer], [employer], EAS Edge HP, and Unjury. See handout from pre-op Bariatric Education Class:  Slowly increase the amount of protein shake you drink as tolerated  You may find it easier to slowly sip shakes throughout the day  It is important to get your proteins in first  Your fluid goal is to drink 64 - 100 ounces of fluid daily  It may take a few weeks to build up to this  32 oz (or more) should be clear liquids  And  32 oz (or more) should be full liquids (see below for examples)  Liquids should not contain sugar, caffeine, or carbonation   Clear Liquids:  Water or Sugar-free flavored water (i.e. Fruit H2O, Propel)  Decaffeinated coffee or tea (sugar-free)  Crystal Lite, Wyler's Lite, Minute Maid Lite  Sugar-free Jell-O  Bouillon or broth  Sugar-free Popsicle: *Less than 20 calories each; Limit 1 per day   Full Liquids:  Protein Shakes/Drinks + 2 choices per day of other full liquids  Full liquids must be:  No More Than 12 grams of Carbs per serving  No More Than 3 grams of Fat per serving  Strained low-fat cream soup  Non-Fat milk  Fat-free Lactaid Milk  Sugar-free yogurt (Dannon Lite & Fit, Greek yogurt)     Clayton Bibles, MS, RD, LDN Pager: [phone] After Hours Pager: [phone]

## 2016-02-04 NOTE — Discharge Summary (Signed)
Physician Discharge Summary  Patient ID: Mary Olson MRN: [id number] DOB/AGE: 1985-12-20 30 y.o.  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 Please see operative note. Preoperatively the patient was given 5000 units of subcutaneous heparin for DVT prophylaxis. Postoperative prophylactic Lovenox dosing was started on the morning of postoperative day 1.On pod 1 she looked well with no fever, elevated WBC, and no persistent tachycardia and the patient was started on ice chips and water which they tolerated. Later that day The patient's diet was advanced to protein shakes which they also tolerated. The patient was ambulating without difficulty. Their vital signs are stable without fever or tachycardia. Their hemoglobin had remained stable. The patient had received discharge instructions and counseling. They were deemed stable for discharge.  Consults: nutrition  Significant Diagnostic Studies: labs:  CBC Latest Ref Rng & Units 02/04/2016 02/03/2016 01/28/2016  WBC 4.0 - 10.5 K/uL 8.5 - 4.7  Hemoglobin 12.0 - 15.0 g/dL 12.1 12.4 12.8  Hematocrit 36.0 - 46.0 % 37.9 38.7 40.3  Platelets 150 - 400 K/uL 215 - 232   BMET    Component Value Date/Time   NA 136 02/04/2016 0453   K 4.0 02/04/2016 0453   CL 107 02/04/2016 0453   CO2 23 02/04/2016 0453   GLUCOSE 122 (H) 02/04/2016  0453   BUN 7 02/04/2016 0453   CREATININE 0.77 02/04/2016 0453   CALCIUM 9.1 02/04/2016 0453   GFRNONAA >60 02/04/2016 0453   GFRAA >60 02/04/2016 0453   Hepatic Function Latest Ref Rng & Units 02/04/2016 01/28/2016  Total Protein 6.5 - 8.1 g/dL 7.1 7.8  Albumin 3.5 - 5.0 g/dL 3.9 4.4  AST 15 - 41 U/L 39 27  ALT 14 - 54 U/L 35 35  Alk Phosphatase 38 - 126 U/L 51 57  Total Bilirubin 0.3 - 1.2 mg/dL 0.2(L) 0.5     Treatments: IV hydration, analgesia: acetaminophen, Morphine and oxycodone and surgery: see above  Discharge vitals: Blood pressure 140/73, pulse 79, temperature 97.1 F (36.2 C), temperature source Oral, resp. rate 15, height 5' 11.5" (1.816 m), weight (!) 157.5 kg (347 lb 5 oz), last menstrual period 12/15/2015, SpO2 97 %.   Disposition: home  Discharge Instructions    Ambulate hourly while awake    Complete by:  As directed   Call MD for:  difficulty breathing, headache or visual disturbances    Complete by:  As directed   Call MD for:  persistant dizziness or light-headedness    Complete by:  As directed   Call MD for:  persistant nausea and vomiting    Complete by:  As directed   Call MD for:  redness, tenderness, or signs of infection (pain, swelling, redness, odor or green/yellow discharge around incision site)    Complete by:  As directed   Call MD for:  severe uncontrolled pain    Complete by:  As directed   Call MD  for:  temperature >101 F    Complete by:  As directed   Diet bariatric full liquid    Complete by:  As directed   Discharge instructions    Complete by:  As directed   See bariatric discharge instructions   Incentive spirometry    Complete by:  As directed   Perform hourly while awake       Medication List    TAKE these medications   CALCIUM PO Take 1 capsule by mouth daily.   multivitamin tablet Take 1 tablet by mouth daily.   oxyCODONE 5 MG/5ML solution Commonly known as:  ROXICODONE Take 5-10 mLs (5-10 mg total) by mouth every 4  (four) hours as needed for moderate pain or severe pain.   vitamin B-12 100 MCG tablet Commonly known as:  CYANOCOBALAMIN Take 100 mcg by mouth daily.      Follow-up McPherson, MD .   Specialty:  General Surgery Contact information: [address] [place] [address] [place], MD .   Specialty:  General Surgery Contact information: [place] [address] [place] [address] [phone]          Eula Jaster M. Redmond Pulling, MD, FACS General, Bariatric, & Minimally Invasive Surgery [facility], [place]  Signed: [name] 02/04/2016, 4:29 PM

## 2016-02-04 NOTE — Progress Notes (Signed)
Patient ambulating well in hallway.  Discharge instructions reviewed with patient.  Patient verbalized understanding. Patient has no signs  or symptoms of distress.  Patient discharged home via wheelchair.

## 2016-02-04 NOTE — Progress Notes (Signed)
Patient alert and oriented, pain is controlled. Patient is tolerating fluids, advanced to protein shake today, patient is tolerating well.  Reviewed Gastric sleeve discharge instructions with patient and patient is able to articulate understanding.  Provided information on BELT program, Support Group and [facility] outpatient pharmacy. All questions answered, will continue to monitor.  

## 2016-02-17 ENCOUNTER — Encounter: Payer: BLUE CROSS/BLUE SHIELD | Attending: General Surgery

## 2016-02-17 DIAGNOSIS — Z01818 Encounter for other preprocedural examination: Secondary | ICD-10-CM | POA: Insufficient documentation

## 2016-02-17 NOTE — Progress Notes (Signed)
Bariatric Class:  Appt start time: 1530 end time:  1630.  2 Week Post-Operative Nutrition Class  Patient was seen on 02/17/2016 for Post-Operative Nutrition education at [facility].   Surgery date: 02/03/2016 Surgery type: Sleeve gastrectomy Start weight at [facility]: 359 lbs on 10/15/2015 Weight today: 325.2 lbs  Weight change: 32.2 lbs  TANITA  BODY COMP RESULTS  01/19/16 02/17/16   BMI (kg/m^2) 49.8 45.4   Fat Mass (lbs) 204.4 182.2   Fat Free Mass (lbs) 153 143.0   Total Body Water (lbs) 116.6 108.4   The following the learning objectives were met by the patient during this course:  Identifies Phase 3A (Soft, High Proteins) Dietary Goals and will begin from 2 weeks post-operatively to 2 months post-operatively  Identifies appropriate sources of fluids and proteins   States protein recommendations and appropriate sources post-operatively  Identifies the need for appropriate texture modifications, mastication, and bite sizes when consuming solids  Identifies appropriate multivitamin and calcium sources post-operatively  Describes the need for physical activity post-operatively and will follow MD recommendations  States when to call healthcare provider regarding medication questions or post-operative complications  Handouts given during class include:  Phase 3A: Soft, High Protein Diet Handout  Follow-Up Plan: Patient will follow-up at [facility] in 6 weeks for 2 month post-op nutrition visit for diet advancement per MD.

## 2016-03-30 ENCOUNTER — Encounter: Payer: Self-pay | Admitting: Dietician

## 2016-03-30 ENCOUNTER — Encounter: Payer: BLUE CROSS/BLUE SHIELD | Attending: General Surgery | Admitting: Dietician

## 2016-03-30 DIAGNOSIS — Z01818 Encounter for other preprocedural examination: Secondary | ICD-10-CM | POA: Insufficient documentation

## 2016-03-30 NOTE — Progress Notes (Signed)
  Follow-up visit:  8 Weeks Post-Operative Sleeve gastrectomy Surgery  Medical Nutrition Therapy:  Appt start time: 855 end time:  0910  Primary concerns today: Post-operative Bariatric Surgery Nutrition Management. Talya returns having lost a total of 46 pounds. She reports an aversion to chicken. Eating eggs, beans, fish, and beef for protein sources. Can usually tolerate about 2 oz meat. Listening to fullness cues. Sometimes has crackers when she is feeling nauseated.   Surgery date: 02/03/2016 Surgery type: Sleeve gastrectomy [facility] lbs on 10/15/2015 Weight today: 312.6 lbs Weight change: 12.6 lbs Total weight lost: 46.4 lbs  TANITA  BODY COMP RESULTS  01/19/16 02/17/16 03/30/16   BMI (kg/m^2) 49.8 45.4 43.6   Fat Mass (lbs) 204.4 182.2 166   Fat Free Mass (lbs) 153 143.0 146.6   Total Body Water (lbs) 116.6 108.4 110.2    Preferred Learning Style:  No preference indicated   Learning Readiness:   Ready  24-hr recall: B (AM): 1/2 Premier protein shake (15g) Snk (AM): egg (6g) L (PM): 2 oz beef or seafood (14g) Snk (PM): rest of protein shake (15g) D (PM): 2 oz meat or eggs (14g) Snk (PM):   Fluid intake: 50-60 water, 11 oz protein shake, sometimes 4 oz orange juice  Estimated total protein intake: 60-70 grams per day  Medications: none Supplementation: taking  Using straws: no Drinking while eating: no Hair loss: none Carbonated beverages: no N/V/D/C: constipation Dumping syndrome: no  Recent physical activity:  BELT 3x a week and walking 45 minutes on off days  Progress Towards Goal(s):  In progress.  Handouts given during visit include:  Phase 3B lean protein + non starchy vegetables   Nutritional Diagnosis:  [place]-3.3 Overweight/obesity related to past poor dietary habits and physical inactivity as evidenced by patient w/ recent sleeve gastrectomy surgery following dietary guidelines for continued weight loss.    Intervention:  Nutrition  counseling provided.  Teaching Method Utilized:  Visual Auditory Hands on  Barriers to learning/adherence to lifestyle change: none  Demonstrated degree of understanding via:  Teach Back   Monitoring/Evaluation:  Dietary intake, exercise, and body weight. Follow up in 3 months for 5 month post-op visit.

## 2016-03-30 NOTE — Patient Instructions (Addendum)
Goals:  Follow Phase 3B: High Protein + Non-Starchy Vegetables  Eat 3-6 small meals/snacks, every 3-5 hrs  Increase lean protein foods to meet 60g goal  Increase fluid intake to 64oz +  Avoid drinking 15 minutes before, during and 30 minutes after eating  Aim for >30 min of physical activity daily  Limit carbs like crackers and juice  Surgery date: 02/03/2016 Surgery type: Sleeve gastrectomy [facility] lbs on 10/15/2015 Weight today: 312.6 lbs Weight change: 12.6 lbs Total weight lost: 46.4 lbs  TANITA  BODY COMP RESULTS  01/19/16 02/17/16 03/30/16   BMI (kg/m^2) 49.8 45.4 43.6   Fat Mass (lbs) 204.4 182.2 166   Fat Free Mass (lbs) 153 143.0 146.6   Total Body Water (lbs) 116.6 108.4 110.2

## 2016-06-29 ENCOUNTER — Encounter: Payer: BLUE CROSS/BLUE SHIELD | Attending: Internal Medicine | Admitting: Dietician

## 2016-06-29 ENCOUNTER — Encounter: Payer: Self-pay | Admitting: Dietician

## 2016-06-29 DIAGNOSIS — Z713 Dietary counseling and surveillance: Secondary | ICD-10-CM | POA: Diagnosis present

## 2016-06-29 DIAGNOSIS — Z79899 Other long term (current) drug therapy: Secondary | ICD-10-CM | POA: Diagnosis not present

## 2016-06-29 NOTE — Patient Instructions (Addendum)
-  Continue to focus on nonscale victories and nonscale goals  Surgery date: 02/03/2016 Surgery type: Sleeve gastrectomy [facility] lbs on 10/15/2015 Weight today: 286.2 lbs Weight change: 26 lbs Total weight lost: 74 lbs Goal weight: 200 lbs  TANITA  BODY COMP RESULTS  01/19/16 02/17/16 03/30/16 06/29/16   BMI (kg/m^2) 49.8 45.4 43.6 39.9   Fat Mass (lbs) 204.4 182.2 166 149.6   Fat Free Mass (lbs) 153 143.0 146.6 136.6   Total Body Water (lbs) 116.6 108.4 110.2 102

## 2016-06-29 NOTE — Progress Notes (Signed)
  Follow-up visit:  5 months Post-Operative Sleeve gastrectomy Surgery  Medical Nutrition Therapy:  Appt start time: 900 end time:  920  Primary concerns today: Post-operative Bariatric Surgery Nutrition Management. Allicia returns having lost a total of 74 pounds. Has been noticing some cravings for crunchy salty foods and trying to keep busy. Sometimes has some roasted chickpeas or roasted almonds. Has been going to support group when she is able. Still struggles with chicken. Weighs herself weekly and tries to focus more on nonscale victories. Goal: complete a spin class   Surgery date: 02/03/2016 Surgery type: Sleeve gastrectomy [facility] lbs on 10/15/2015 Weight today: 286.2 lbs Weight change: 26 lbs Total weight lost: 74 lbs Goal weight: 200 lbs  TANITA  BODY COMP RESULTS  01/19/16 02/17/16 03/30/16 06/29/16   BMI (kg/m^2) 49.8 45.4 43.6 39.9   Fat Mass (lbs) 204.4 182.2 166 149.6   Fat Free Mass (lbs) 153 143.0 146.6 136.6   Total Body Water (lbs) 116.6 108.4 110.2 102    Preferred Learning Style:  No preference indicated   Learning Readiness:   Ready  24-hr recall: B (AM): 1/2 Premier protein shake (15g) Snk (AM): egg with cheese (8-10g) L (PM): 4-5 oz beef or seafood with vegetables (28-35) Snk (PM): cheese stick or almonds or apple (0-5g) D (PM): tuna salad or omelet (14g) Snk (PM):   Fluid intake: 50-60 oz water, sometimes with lime, cucumber, or mint Estimated total protein intake:   Medications: none Supplementation: taking  Using straws: no Drinking while eating: no Hair loss: yes Carbonated beverages: no N/V/D/C: constipation, takes fiber supplement per surgeon recommendation  Dumping syndrome: no  Recent physical activity:  BELT 3x a week and sometimes Zumba or other workouts on off days  Progress Towards Goal(s):  In progress.  Handouts given during visit include:  none   Nutritional Diagnosis:  [place]-3.3 Overweight/obesity related to  past poor dietary habits and physical inactivity as evidenced by patient w/ recent sleeve gastrectomy surgery following dietary guidelines for continued weight loss.    Intervention:  Nutrition counseling provided.  Teaching Method Utilized:  Visual Auditory Hands on  Barriers to learning/adherence to lifestyle change: none  Demonstrated degree of understanding via:  Teach Back   Monitoring/Evaluation:  Dietary intake, exercise, and body weight. Follow up in 6 months for 11 month post-op visit.

## 2016-08-26 ENCOUNTER — Encounter: Payer: BLUE CROSS/BLUE SHIELD | Admitting: Women's Health

## 2016-10-27 ENCOUNTER — Encounter: Payer: Self-pay | Admitting: Gynecology

## 2016-12-28 ENCOUNTER — Ambulatory Visit: Payer: Self-pay | Admitting: Skilled Nursing Facility1

## 2017-01-03 IMAGING — DX DG CHEST 2V
1 series · 1 of 1 positions shown · non-contrast
Comparison: None.

CLINICAL DATA: Morbid obesity preop evaluation

EXAM:
CHEST  2 VIEW

[chest pa]
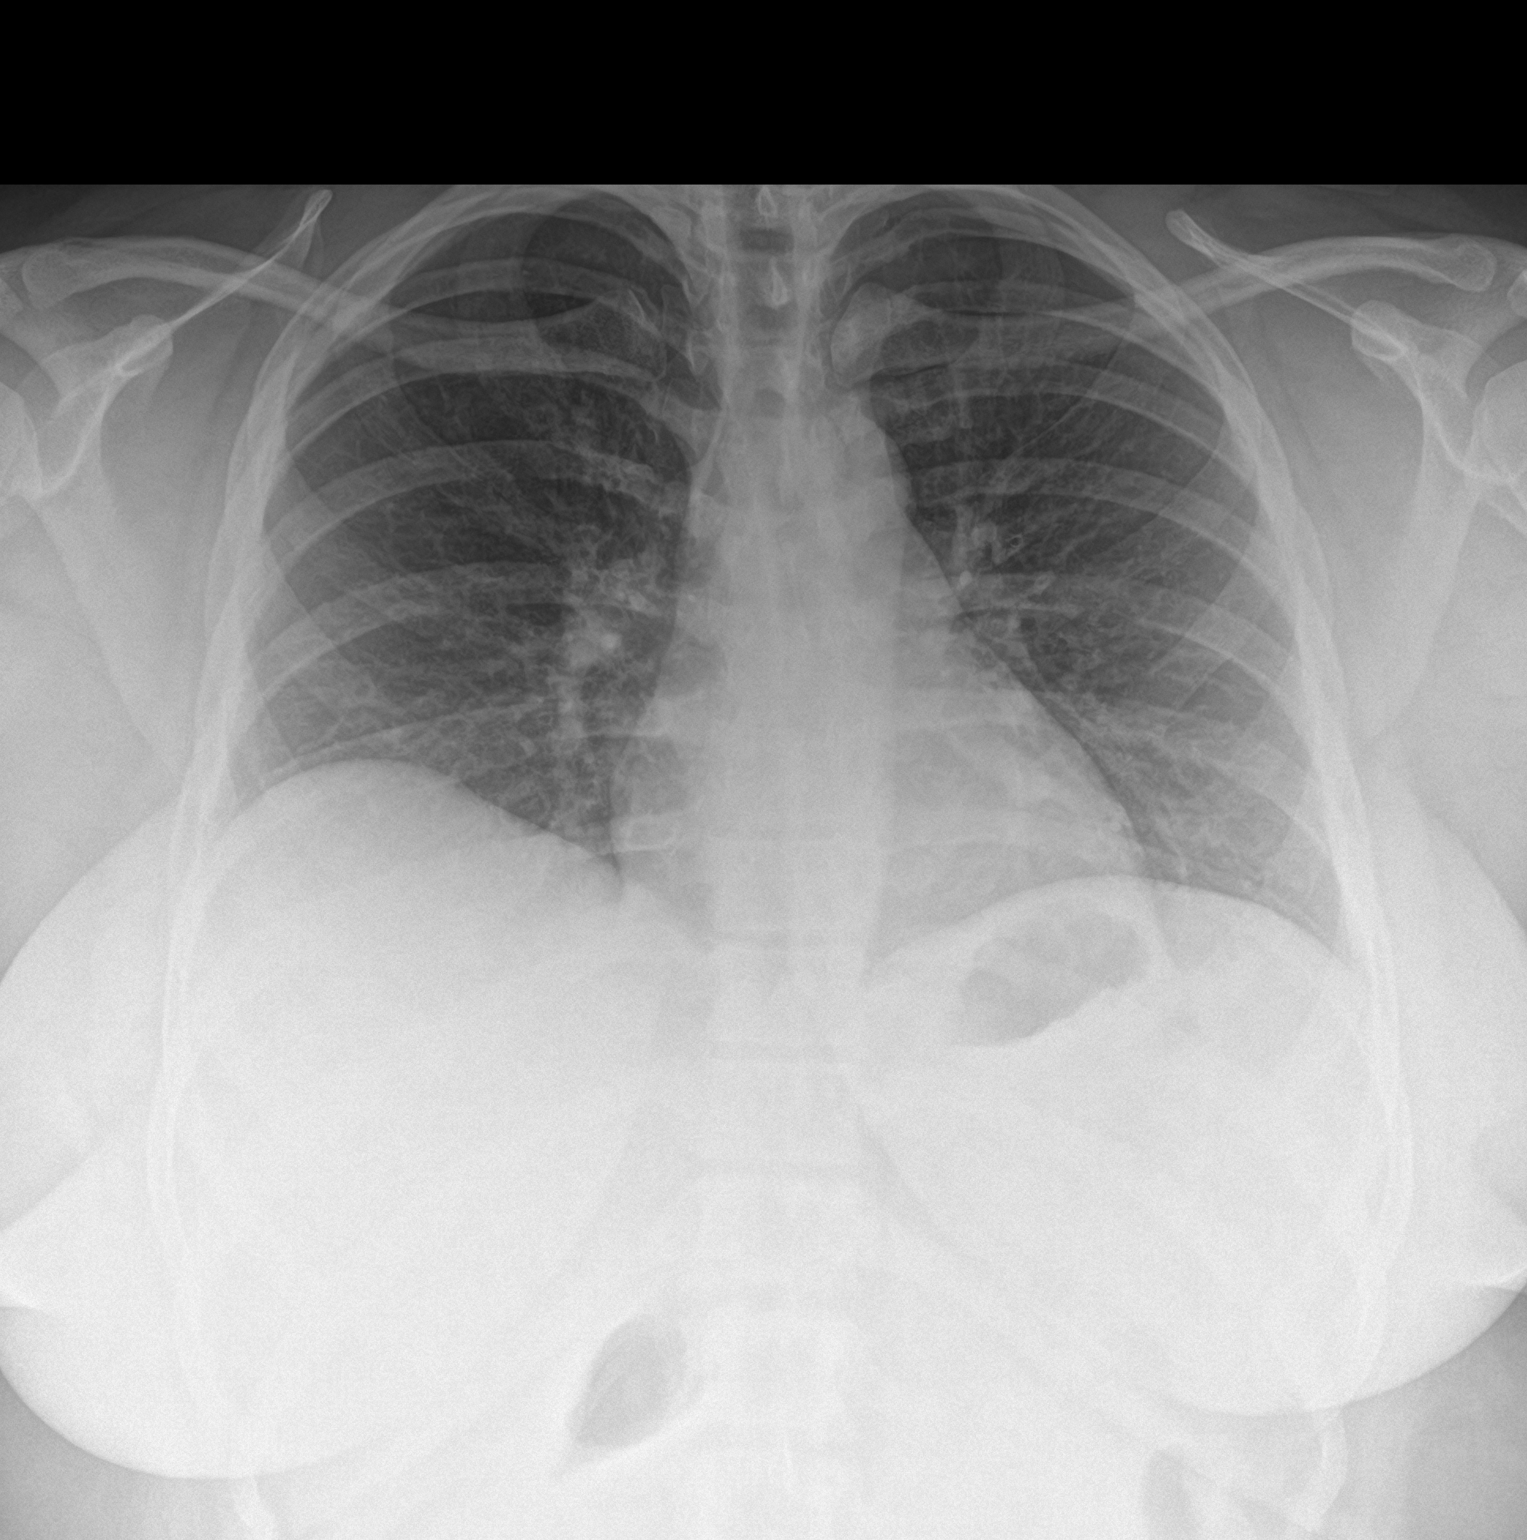

[1 of 1 positions shown; findings below may reference images not displayed]

FINDINGS: The heart size and mediastinal contours are within normal limits.
Both lungs are clear. The visualized skeletal structures are
unremarkable.
IMPRESSION: No active cardiopulmonary disease.

## 2017-01-05 ENCOUNTER — Encounter: Payer: BLUE CROSS/BLUE SHIELD | Attending: General Surgery | Admitting: Skilled Nursing Facility1

## 2017-01-05 ENCOUNTER — Encounter: Payer: Self-pay | Admitting: Skilled Nursing Facility1

## 2017-01-05 DIAGNOSIS — Z9884 Bariatric surgery status: Secondary | ICD-10-CM | POA: Diagnosis not present

## 2017-01-05 DIAGNOSIS — Z713 Dietary counseling and surveillance: Secondary | ICD-10-CM | POA: Diagnosis present

## 2017-01-05 DIAGNOSIS — E669 Obesity, unspecified: Secondary | ICD-10-CM

## 2017-01-05 NOTE — Patient Instructions (Addendum)
-  Try to get in 80 fluid ounces a day not including coffee  -Try to have vegetables with every lunch and dinner  -You can probably stop the b12

## 2017-01-05 NOTE — Progress Notes (Signed)
Sleeve gastrectomy Surgery  Medical Nutrition Therapy:  Appt start time: 900 end time:  920  Primary concerns today: Post-operative Bariatric Surgery Nutrition Management. Pt states things are not good. Pt states her work schedule has changed which has stopped her from working out but she has gotten back into that. Pt states sometimes she grabs foods that are not as healthy like crackers or vegetable chips. Pt states she sleeps 8-9 hours with rest. Pt states she wants the weight to come off faster.    Surgery date: 02/03/2016 Surgery type: Sleeve gastrectomy [facility] lbs on 10/15/2015 Weight today: 277.4 lbs Weight change: 8.8 lbs Total weight lost: 82.8 lbs Goal weight: 200 lbs  TANITA  BODY COMP RESULTS  01/19/16 02/17/16 03/30/16 06/29/16 01/05/2017   BMI (kg/m^2) 49.8 45.4 43.6 39.9 38.7   Fat Mass (lbs) 204.4 182.2 166 149.6 139.8   Fat Free Mass (lbs) 153 143.0 146.6 136.6 137.8   Total Body Water (lbs) 116.6 108.4 110.2 102 102.6    Preferred Learning Style:  No preference indicated   Learning Readiness:   Ready  24-hr recall: B (AM): 1/2 Premier protein shake (15g) Snk (AM): 2 egg with cheese (8-10g) L (PM): 4-5 oz beef or seafood with vegetables (28-35) Snk (PM): cheese stick or almonds or apple (0-5g) D (PM): tuna salad with honey mustard and mayonaise or omelet (14g) Snk (PM):   Fluid intake: 50-60 oz water, sometimes with lime, cucumber, or mint, coffee, pineapple water, 2% milk 1 time a week Estimated total protein intake: 70+  Medications: none Supplementation: taking bari advantage and calcium and b12  Using straws: no Drinking while eating: no Hair loss: yes Carbonated beverages: no N/V/D/C: constipation, takes fiber supplement per surgeon recommendation  Dumping syndrome: no   Recent physical activity: bootcamp program: cardio and weights: 5 days a week 30-40 minutes  Progress Towards Goal(s):  In progress.  Handouts given during  visit include:  none   Nutritional Diagnosis:  [place]-3.3 Overweight/obesity related to past poor dietary habits and physical inactivity as evidenced by patient w/ recent sleeve gastrectomy surgery following dietary guidelines for continued weight loss.    Intervention:  Nutrition counseling provided. Goals: -Try to get in 80 fluid ounces a day not including coffee -Try to have vegetables with every lunch and dinner -You can probably stop the b12  Teaching Method Utilized:  Visual Auditory Hands on  Barriers to learning/adherence to lifestyle change: none  Demonstrated degree of understanding via:  Teach Back   Monitoring/Evaluation:  Dietary intake, exercise, and body weight.

## 2017-07-06 ENCOUNTER — Ambulatory Visit: Payer: Self-pay | Admitting: Skilled Nursing Facility1
